# Patient Record
Sex: Female | Born: 1955
Health system: Southern US, Community
[De-identification: ages and names within clinical notes are randomized; demographics above are authoritative.]

## PROBLEM LIST (undated history)

## (undated) DIAGNOSIS — M8580 Other specified disorders of bone density and structure, unspecified site: Secondary | ICD-10-CM

## (undated) DIAGNOSIS — R232 Flushing: Secondary | ICD-10-CM

## (undated) DIAGNOSIS — Z780 Asymptomatic menopausal state: Secondary | ICD-10-CM

## (undated) DIAGNOSIS — N952 Postmenopausal atrophic vaginitis: Secondary | ICD-10-CM

## (undated) HISTORY — PX: WISDOM TOOTH EXTRACTION: SHX21

## (undated) HISTORY — DX: Postmenopausal atrophic vaginitis: N95.2

## (undated) HISTORY — PX: POLYPECTOMY: SHX149

## (undated) HISTORY — DX: Other specified disorders of bone density and structure, unspecified site: M85.80

## (undated) HISTORY — DX: Asymptomatic menopausal state: Z78.0

## (undated) HISTORY — DX: Flushing: R23.2

## (undated) HISTORY — PX: COLONOSCOPY: SHX174

---
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
## 2004-10-04 ENCOUNTER — Ambulatory Visit: Payer: Self-pay | Admitting: Obstetrics and Gynecology

## 2004-11-07 ENCOUNTER — Emergency Department: Payer: Self-pay | Admitting: Unknown Physician Specialty

## 2005-10-06 ENCOUNTER — Ambulatory Visit: Payer: Self-pay | Admitting: Obstetrics and Gynecology

## 2006-03-13 LAB — HM COLONOSCOPY

## 2006-09-03 ENCOUNTER — Ambulatory Visit: Payer: Self-pay | Admitting: Internal Medicine

## 2006-09-12 ENCOUNTER — Ambulatory Visit: Payer: Self-pay | Admitting: Internal Medicine

## 2006-10-09 ENCOUNTER — Ambulatory Visit: Payer: Self-pay | Admitting: Obstetrics and Gynecology

## 2006-10-12 ENCOUNTER — Ambulatory Visit: Payer: Self-pay | Admitting: Obstetrics and Gynecology

## 2007-04-22 ENCOUNTER — Ambulatory Visit: Payer: Self-pay | Admitting: Obstetrics and Gynecology

## 2007-10-11 ENCOUNTER — Ambulatory Visit: Payer: Self-pay | Admitting: Obstetrics and Gynecology

## 2008-10-22 ENCOUNTER — Ambulatory Visit: Payer: Self-pay | Admitting: Obstetrics and Gynecology

## 2009-10-26 ENCOUNTER — Ambulatory Visit: Payer: Self-pay | Admitting: Obstetrics and Gynecology

## 2010-10-28 ENCOUNTER — Ambulatory Visit: Payer: Self-pay | Admitting: Obstetrics and Gynecology

## 2011-10-31 ENCOUNTER — Ambulatory Visit: Payer: Self-pay | Admitting: Obstetrics and Gynecology

## 2012-09-09 LAB — HM PAP SMEAR: HM PAP: NEGATIVE

## 2012-10-31 ENCOUNTER — Ambulatory Visit: Payer: Self-pay | Admitting: Obstetrics and Gynecology

## 2013-11-10 ENCOUNTER — Ambulatory Visit: Payer: Self-pay | Admitting: Obstetrics and Gynecology

## 2013-11-10 LAB — HM MAMMOGRAPHY

## 2014-09-23 ENCOUNTER — Encounter: Payer: Self-pay | Admitting: Obstetrics and Gynecology

## 2014-11-11 ENCOUNTER — Ambulatory Visit (INDEPENDENT_AMBULATORY_CARE_PROVIDER_SITE_OTHER): Payer: 59 | Admitting: Obstetrics and Gynecology

## 2014-11-11 ENCOUNTER — Encounter: Payer: Self-pay | Admitting: Obstetrics and Gynecology

## 2014-11-11 VITALS — BP 133/80 | HR 93 | Ht 63.0 in | Wt 138.5 lb

## 2014-11-11 DIAGNOSIS — Z1211 Encounter for screening for malignant neoplasm of colon: Secondary | ICD-10-CM

## 2014-11-11 DIAGNOSIS — Z780 Asymptomatic menopausal state: Secondary | ICD-10-CM

## 2014-11-11 DIAGNOSIS — Z0000 Encounter for general adult medical examination without abnormal findings: Secondary | ICD-10-CM

## 2014-11-11 DIAGNOSIS — Z1231 Encounter for screening mammogram for malignant neoplasm of breast: Secondary | ICD-10-CM

## 2014-11-11 DIAGNOSIS — N952 Postmenopausal atrophic vaginitis: Secondary | ICD-10-CM

## 2014-11-11 DIAGNOSIS — Z01419 Encounter for gynecological examination (general) (routine) without abnormal findings: Secondary | ICD-10-CM

## 2014-11-11 DIAGNOSIS — M8580 Other specified disorders of bone density and structure, unspecified site: Secondary | ICD-10-CM

## 2014-11-11 NOTE — Progress Notes (Signed)
Patient ID: Jessica Frey, female   DOB: 03-30-1955, 59 y.o.   MRN: [id number] ANNUAL PREVENTATIVE CARE GYN  ENCOUNTER NOTE  Subjective:       Jessica Frey is a 59 y.o. No obstetric history on file. female here for a routine annual gynecologic exam.  Current complaints: 1.  None     Gynecologic History No LMP recorded. Patient is postmenopausal. Contraception: post menopausal status Last Pap: 2014 neg/neg. Results were: normal Last mammogram: 2015 birad 1. Results were: normal  Obstetric History Para 2002  Past Medical History  Diagnosis Date  . Menopause   . Osteopenia   . Vaginal atrophy   . Hot flashes     Past Surgical History  Procedure Laterality Date  . Cesarean section      [id number]  . Hernia repair  1959    Current Outpatient Prescriptions on File Prior to Visit  Medication Sig Dispense Refill  . calcium-vitamin D 250-100 MG-UNIT per tablet Take 1 tablet by mouth 2 (two) times daily.    . Collagen 500 MG CAPS Take by mouth.    Marland Kitchen geriatric multivitamins-minerals (ELDERTONIC/GEVRABON) ELIX Take 15 mLs by mouth daily.    . Vaginal Lubricant (REPLENS) GEL Place vaginally.     No current facility-administered medications on file prior to visit.    No Known Allergies  Social History   Social History  . Marital Status: Married    Spouse Name: N/A  . Number of Children: N/A  . Years of Education: N/A   Occupational History  . Not on file.   Social History Main Topics  . Smoking status: Never Smoker   . Smokeless tobacco: Not on file  . Alcohol Use: No  . Drug Use: No  . Sexual Activity: Yes   Other Topics Concern  . Not on file   Social History Narrative    Family History  Problem Relation Age of Onset  . Cancer Neg Hx   . Diabetes Neg Hx   . Heart disease Father     The following portions of the patient's history were reviewed and updated as appropriate: allergies, current medications, past family history, past medical history, past social  history, past surgical history and problem list.  Review of Systems ROS Review of Systems - General ROS: negative for - chills, fatigue, fever, hot flashes, night sweats, weight gain or weight loss Psychological ROS: negative for - anxiety, decreased libido, depression, mood swings, physical abuse or sexual abuse Ophthalmic ROS: negative for - blurry vision, eye pain or loss of vision ENT ROS: negative for - headaches, hearing change, visual changes or vocal changes Allergy and Immunology ROS: negative for - hives, itchy/watery eyes or seasonal allergies Hematological and Lymphatic ROS: negative for - bleeding problems, bruising, swollen lymph nodes or weight loss Endocrine ROS: negative for - galactorrhea, hair pattern changes, hot flashes, malaise/lethargy, mood swings, palpitations, polydipsia/polyuria, skin changes, temperature intolerance or unexpected weight changes Breast ROS: negative for - new or changing breast lumps or nipple discharge Respiratory ROS: negative for - cough or shortness of breath Cardiovascular ROS: negative for - chest pain, irregular heartbeat, palpitations or shortness of breath Gastrointestinal ROS: no abdominal pain, change in bowel habits, or black or bloody stools Genito-Urinary ROS: no dysuria, trouble voiding, or hematuria Musculoskeletal ROS: negative for - joint pain or joint stiffness Neurological ROS: negative for - bowel and bladder control changes Dermatological ROS: negative for rash and skin lesion changes   Objective:   BP 133/80 mmHg  Pulse 93  Ht 5\' 3"  (1.6 m)  Wt 138 lb 8 oz (62.823 kg)  BMI 24.54 kg/m2 CONSTITUTIONAL: Well-developed, well-nourished female in no acute distress.  PSYCHIATRIC: Normal mood and affect. Normal behavior. Normal judgment and thought content. Raymond: Alert and oriented to person, place, and time. Normal muscle tone coordination. No cranial nerve deficit noted. HENT:  Normocephalic, atraumatic, External right  and left ear normal. Oropharynx is clear and moist EYES: Conjunctivae and EOM are normal. Pupils are equal, round, and reactive to light. No scleral icterus.  NECK: Normal range of motion, supple, no masses.  Normal thyroid.  SKIN: Skin is warm and dry. No rash noted. Not diaphoretic. No erythema. No pallor. CARDIOVASCULAR: Normal heart rate noted, regular rhythm, no murmur. RESPIRATORY: Clear to auscultation bilaterally. Effort and breath sounds normal, no problems with respiration noted. BREASTS: Symmetric in size. No masses, skin changes, nipple drainage, or lymphadenopathy. ABDOMEN: Soft, normal bowel sounds, no distention noted.  No tenderness, rebound or guarding.  BLADDER: Normal PELVIC:  External Genitalia: Normal  BUS: Normal  Vagina: Narrowed introitus; moderate to severe atrophy  Cervix: Normal; no cervical motion tenderness  Uterus: Normal; midplane, small, mobile  Adnexa: Normal  RV: External Exam NormaI, No Rectal Masses and Normal Sphincter tone  MUSCULOSKELETAL: Normal range of motion. No tenderness.  No cyanosis, clubbing, or edema.  2+ distal pulses. LYMPHATIC: No Axillary, Supraclavicular, or Inguinal Adenopathy.    Assessment:   Annual gynecologic examination 59 y.o. Contraception: post menopausal status Normal BMI Vaginal atrophy-moderate to severe; patient declines Estrogen therapy  Plan:  Pap: Not needed Mammogram: Ordered Stool Guaiac Testing:  Ordered Labs: thru employer Routine preventative health maintenance measures emphasized: Exercise/Diet/Weight control, Tobacco Warnings and Alcohol/Substance use risks Vaginal atrophy as well as moderate to severe-recommendation is to use JO H20 gel as lubricant. Patient declines vaginal estrogen therapy. Return to [place], [place]  Brayton Mars, MD

## 2014-11-11 NOTE — Patient Instructions (Signed)
1. No pap today. 2. Schedule Mammogram 3. Stool cards for Colon Cancer screening. 4. JO H2O Lubricant as needed 5. Return in 1 year.

## 2014-11-16 DIAGNOSIS — Z780 Asymptomatic menopausal state: Secondary | ICD-10-CM | POA: Insufficient documentation

## 2014-11-16 DIAGNOSIS — M8580 Other specified disorders of bone density and structure, unspecified site: Secondary | ICD-10-CM | POA: Insufficient documentation

## 2014-11-16 DIAGNOSIS — N952 Postmenopausal atrophic vaginitis: Secondary | ICD-10-CM | POA: Insufficient documentation

## 2014-11-23 ENCOUNTER — Ambulatory Visit
Admission: RE | Admit: 2014-11-23 | Discharge: 2014-11-23 | Disposition: A | Discharge status: Home / Self Care | Payer: 59 | Source: Ambulatory Visit | Attending: Obstetrics and Gynecology | Admitting: Obstetrics and Gynecology

## 2014-11-23 ENCOUNTER — Other Ambulatory Visit: Payer: Self-pay | Admitting: Obstetrics and Gynecology

## 2014-11-23 DIAGNOSIS — Z1231 Encounter for screening mammogram for malignant neoplasm of breast: Secondary | ICD-10-CM | POA: Insufficient documentation

## 2015-09-27 ENCOUNTER — Ambulatory Visit: Payer: 59 | Attending: Orthopedic Surgery | Admitting: Physical Therapy

## 2015-09-27 DIAGNOSIS — M25562 Pain in left knee: Secondary | ICD-10-CM | POA: Insufficient documentation

## 2015-09-27 DIAGNOSIS — M6281 Muscle weakness (generalized): Secondary | ICD-10-CM | POA: Diagnosis present

## 2015-09-27 NOTE — Therapy (Signed)
[facility] [address], [place], [address] Phone: [phone]   Fax:  [phone]  Physical Therapy Evaluation  Patient Details  Name: Jessica Frey MRN: [id number] Date of Birth: 1955/03/16 Referring Provider: Rise Paganini Date: 09/27/2015      PT End of Session - 09/27/15 1250    Visit Number 1   Number of Visits 13   Date for PT Re-Evaluation 11/08/15   PT Start Time 1115   PT Stop Time 1200   PT Time Calculation (min) 45 min   Activity Tolerance Patient tolerated treatment well      Past Medical History  Diagnosis Date  . Menopause   . Osteopenia   . Vaginal atrophy   . Hot flashes     Past Surgical History  Procedure Laterality Date  . Cesarean section      [id number]  . Hernia repair  1959    There were no vitals filed for this visit.       Subjective Assessment - 09/27/15 1238    Subjective Pt reports several months of inconsistent L knee pain. Pain began with exercise, running. Since that time pt has had several flare ups of pain. Currently pt is avoiding running and all irritating factors and is pain free, but is not feeling normal and is unable to exercise like she used to. Pt denies any previous injuries other than L 5th metatarsal fx several yrs ago.   Pertinent History Denies back pain, denies red flag symptoms.   Diagnostic tests imaging, negative for arthritis   Patient Stated Goals Return to running   Currently in Pain? No/denies            [facility] PT Assessment - 09/27/15 0001    Assessment   Medical Diagnosis patellofemoral pain syndrome of left knee   Referring Provider Donna Christen   Next MD Visit none   Prior Therapy none   Precautions   Precautions None   Balance Screen   Has the patient fallen in the past 6 months No   Has the patient had a decrease in activity level because of a fear of falling?  No   Is the patient reluctant to leave their home because of  a fear of falling?  No   Prior Function   Level of Independence Independent   Vocation Full time employment   Vocation Requirements sitting, walking   Leisure exercise, running, yoga, lifting   ROM / Strength   AROM / PROM / Strength AROM   AROM   Overall AROM Comments Motion assessment: L knee flexion limited at end range by 10 deg., L knee strength grossly 1/2 MMT lower than opposite side.for hip abduction, hip extension   Palpation   Palpation comment (-) for ACL, MCL, PCL, LCL, - JLT,    Special Tests    Special Tests Knee Special Tests   Ambulation/Gait   Gait Comments gait is grossly WNL. noted decr. stance time in SLS on L, stairs are grossly WNL however noted decr. knee flexion on L.  Deferred running assessment for next session.           Objective: Supine bridge, progressing to single leg abduction bridge with extensive cuing. Able to perform 3x5.  Single leg sit<>stands on elevated surface, notably more difficult on involved side but able to work on this.    quad stretch in [place] position with yoga strap, 3x30 sec performed B.  Issued this as  HEP for pt.               PT Education - 09/27/15 1249    Education provided Yes   Education Details HEP   Person(s) Educated Patient   Methods Explanation   Comprehension Verbalized understanding             PT Long Term Goals - 09/27/15 1254    PT LONG TERM GOAL #1   Title Pt will be I with HEP to improve hip MMT to = R side.   Baseline off by 1/2 step   Time 6   Period Weeks   Status New   PT LONG TERM GOAL #2   Title Pt will improve L knee ROM to = R as seen by negative Ely test to reduce pain with ballet activities.   Baseline + Ely test on L.   Time 6   Period Weeks   Status New   PT LONG TERM GOAL #3   Title Pt will be able to run 12 miles per week pain free   Baseline unable to run without pain   Time 6   Period Weeks   Status New               Plan - 09/27/15 1250     Clinical Impression Statement Pt is a pleasant 60 y/o female with c/o chronic knee pain related to activity. She is currently able to perform yoga and ballet exercises, however is unable to run due to pain. Currently pt presents with significant mobility deficits including tightness in quads, weakness in L hip musculature, and poor motor control on L side possibly related to previous metatarsal fx  on L side. Pt would benefit from skilled PT to address these issues and allow return to running and higher level PLOF.   Rehab Potential Good   Clinical Impairments Affecting Rehab Potential motivation, PLOF   PT Frequency 2x / week   PT Duration 6 weeks   PT Treatment/Interventions ADLs/Self Care Home Management;Aquatic Therapy;Neuromuscular re-education;Patient/family education;Dry needling;Manual techniques;Therapeutic exercise;Therapeutic activities   PT Next Visit Plan assess running    Consulted and Agree with Plan of Care Patient      Patient will benefit from skilled therapeutic intervention in order to improve the following deficits and impairments:  Pain, Decreased range of motion, Decreased strength, Improper body mechanics  Visit Diagnosis: Pain in left knee  Muscle weakness (generalized)     Problem List Patient Active Problem List   Diagnosis Date Noted  . Menopause 11/16/2014  . Vaginal atrophy 11/16/2014  . Osteopenia 11/16/2014    Fisher,Benjamin PT DPT 09/27/2015, 12:57 PM  [facility] [address] [place], [address] Phone: [phone]   Fax:  [phone]  Name: Jessica Frey MRN: [id number] Date of Birth: 10-Jun-1955

## 2015-09-29 ENCOUNTER — Ambulatory Visit: Payer: 59 | Admitting: Physical Therapy

## 2015-09-29 DIAGNOSIS — M6281 Muscle weakness (generalized): Secondary | ICD-10-CM

## 2015-09-29 DIAGNOSIS — M25562 Pain in left knee: Secondary | ICD-10-CM | POA: Diagnosis not present

## 2015-09-29 NOTE — Therapy (Signed)
[facility] [address], [place], [address] Phone: [phone]   Fax:  [phone]  Physical Therapy Treatment  Patient Details  Name: Jessica Frey MRN: [id number] Date of Birth: [birth year] Referring Provider: Rise Paganini Date: 09/29/2015      PT End of Session - 09/29/15 0815    Visit Number 2   Number of Visits 13   Date for PT Re-Evaluation 11/08/15   PT Start Time 0730   PT Stop Time 0815   PT Time Calculation (min) 45 min   Activity Tolerance Patient tolerated treatment well      Past Medical History  Diagnosis Date  . Menopause   . Osteopenia   . Vaginal atrophy   . Hot flashes     Past Surgical History  Procedure Laterality Date  . Cesarean section      [id number]  . Hernia repair  1959    There were no vitals filed for this visit.      Subjective Assessment - 09/29/15 0814    Subjective Pt reports no pain since previous session. she has been consistent with her HEP.   Pertinent History Denies back pain, denies red flag symptoms.   Diagnostic tests imaging, negative for arthritis   Patient Stated Goals Return to running   Currently in Pain? No/denies                  Objective: Running assessment, cadence is 145. Educated pt on importance of incr. Cadence.  Hop assessment, pt has very poor single leg hop on L, reports no pain but unable to achieve significant distance. No pain.  "runner's 6" issued and performed, 2x10 of:  Heel taps on 4" step with cuing for pelvic control  Hip abductions on 4" step  Hip abductions in low position of heel tap from 4" step  "run" position step downs  "run" position arm swings  "run" position leg oscillations.  Pt required cuing to perform slowly, to work on hip control. Reproduced pain with forward lean step downs. With cuing to avoid lean pain improved consonant with incr. glute  activation.                    PT Long Term Goals - 09/27/15 1254    PT LONG TERM GOAL #1   Title Pt will be I with HEP to improve hip MMT to = R side.   Baseline off by 1/2 step   Time 6   Period Weeks   Status New   PT LONG TERM GOAL #2   Title Pt will improve L knee ROM to = R as seen by negative Ely test to reduce pain with ballet activities.   Baseline + Ely test on L.   Time 6   Period Weeks   Status New   PT LONG TERM GOAL #3   Title Pt will be able to run 12 miles per week pain free   Baseline unable to run without pain   Time 6   Period Weeks   Status New               Plan - 09/29/15 0815    Clinical Impression Statement able to achieve reproduction of pt symptoms with step up when leaning forward, able to correct with cuing for upright posture to improve glute activation. will assess for presence of back symptoms at next session. Issued and had pt perform higher level  frontal plane exercise routine and core stability routine with running challenges/arm/hip swings.    Rehab Potential Good   Clinical Impairments Affecting Rehab Potential motivation, PLOF   PT Frequency 2x / week   PT Duration 6 weeks   PT Treatment/Interventions ADLs/Self Care Home Management;Aquatic Therapy;Neuromuscular re-education;Patient/family education;Dry needling;Manual techniques;Therapeutic exercise;Therapeutic activities   PT Next Visit Plan assess running    Consulted and Agree with Plan of Care Patient      Patient will benefit from skilled therapeutic intervention in order to improve the following deficits and impairments:  Pain, Decreased range of motion, Decreased strength, Improper body mechanics  Visit Diagnosis: Muscle weakness (generalized)     Problem List Patient Active Problem List   Diagnosis Date Noted  . Menopause 11/16/2014  . Vaginal atrophy 11/16/2014  . Osteopenia 11/16/2014    Fisher,Benjamin PT DPT 09/29/2015, 8:17 AM  [facility] [address], [place], [address] Phone: [phone]   Fax:  [phone]  Name: Jessica Frey MRN: [id number] Date of Birth: 1955/07/09

## 2015-10-04 ENCOUNTER — Ambulatory Visit: Payer: 59 | Admitting: Physical Therapy

## 2015-10-04 DIAGNOSIS — M25562 Pain in left knee: Secondary | ICD-10-CM | POA: Diagnosis not present

## 2015-10-04 DIAGNOSIS — M6281 Muscle weakness (generalized): Secondary | ICD-10-CM

## 2015-10-04 NOTE — Therapy (Signed)
[facility] [address], [place], [address] Phone: [phone]   Fax:  [phone]  Physical Therapy Treatment  Patient Details  Name: Jessica Frey MRN: [id number] Date of Birth: 08-12-1955 Referring Provider: Donna Christen  Encounter Date: 10/04/2015      PT End of Session - 10/04/15 1334    Visit Number 3   Number of Visits 13   Date for PT Re-Evaluation 11/08/15   PT Start Time 1300   PT Stop Time 1330   PT Time Calculation (min) 30 min   Activity Tolerance Patient tolerated treatment well      Past Medical History:  Diagnosis Date  . Hot flashes   . Menopause   . Osteopenia   . Vaginal atrophy     Past Surgical History:  Procedure Laterality Date  . CESAREAN SECTION     [id number]  . HERNIA REPAIR  1959    There were no vitals filed for this visit.      Subjective Assessment - 10/04/15 1333    Subjective Pt reports she tried walking 5 miles, had very mild pain in the 5th minute.   Pertinent History Denies back pain, denies red flag symptoms.   Diagnostic tests imaging, negative for arthritis   Patient Stated Goals Return to running   Currently in Pain? No/denies                Objective: Reviewed and corrected: Heel taps 3x10  Heel tap with abduction 3x10  Knee flexion stretching in standing, prone with strap.  Performed knee flexion stretch with C-R which improved ROM to = opposite side  Issued and reviewed extensive return to running program focusing on 5 min warmup, 1 min run 3 min walk for 10 reps, then 5 min cool down. Pt has previously issued warmup program and encouraged to continue with this.  Pt educated on how to assess pain for possible need to stop running if pain is bothering her.  Self mobilization for tib/femur in seated, to be performed if pt develops pain in joint.  Pt verbalized and demonstrated understanding of all  exercises.                      PT Long Term Goals - 09/27/15 1254      PT LONG TERM GOAL #1   Title Pt will be I with HEP to improve hip MMT to = R side.   Baseline off by 1/2 step   Time 6   Period Weeks   Status New     PT LONG TERM GOAL #2   Title Pt will improve L knee ROM to = R as seen by negative Ely test to reduce pain with ballet activities.   Baseline + Ely test on L.   Time 6   Period Weeks   Status New     PT LONG TERM GOAL #3   Title Pt will be able to run 12 miles per week pain free   Baseline unable to run without pain   Time 6   Period Weeks   Status New               Plan - 10/04/15 1334    Clinical Impression Statement pt is now having minimal to no pain, improved control in L hip. is still somewhat limited in end range knee flexion. Would benefit from continued skilled PT to address this.   Rehab Potential  Good   Clinical Impairments Affecting Rehab Potential motivation, PLOF   PT Frequency 2x / week   PT Duration 6 weeks   PT Treatment/Interventions ADLs/Self Care Home Management;Aquatic Therapy;Neuromuscular re-education;Patient/family education;Dry needling;Manual techniques;Therapeutic exercise;Therapeutic activities   PT Next Visit Plan assess running    Consulted and Agree with Plan of Care Patient      Patient will benefit from skilled therapeutic intervention in order to improve the following deficits and impairments:  Pain, Decreased range of motion, Decreased strength, Improper body mechanics  Visit Diagnosis: Muscle weakness (generalized)     Problem List Patient Active Problem List   Diagnosis Date Noted  . Menopause 11/16/2014  . Vaginal atrophy 11/16/2014  . Osteopenia 11/16/2014    Harace Mccluney PT DPT 10/04/2015, 1:35 PM  [facility] [address], [place], [address] Phone: [phone]   Fax:  [phone]  Name: Chiante Genna MRN: [id number] Date of Birth: 28-May-1955

## 2015-10-06 ENCOUNTER — Encounter: Payer: 59 | Admitting: Physical Therapy

## 2015-10-11 ENCOUNTER — Ambulatory Visit: Payer: 59 | Admitting: Physical Therapy

## 2015-10-11 DIAGNOSIS — M25562 Pain in left knee: Secondary | ICD-10-CM

## 2015-10-11 DIAGNOSIS — M6281 Muscle weakness (generalized): Secondary | ICD-10-CM

## 2015-10-11 NOTE — Therapy (Signed)
[facility] [address], [place], [address] Phone: [phone]   Fax:  [phone]  Physical Therapy Treatment  Patient Details  Name: Jessica Frey MRN: [id number] Date of Birth: 07-11-1955 Referring Provider: Rise Paganini Date: 10/11/2015      PT End of Session - 10/11/15 0903    Visit Number 4   Number of Visits 13   Date for PT Re-Evaluation 11/08/15   PT Start Time 0830   PT Stop Time 0858   PT Time Calculation (min) 28 min   Activity Tolerance Patient tolerated treatment well      Past Medical History:  Diagnosis Date  . Hot flashes   . Menopause   . Osteopenia   . Vaginal atrophy     Past Surgical History:  Procedure Laterality Date  . CESAREAN SECTION     [id number]  . HERNIA REPAIR  1959    There were no vitals filed for this visit.      Subjective Assessment - 10/11/15 0902    Subjective Pt reports she has been able to run, has had two instances of knee pain, one with gentle twisting and one with running.   Pertinent History Denies back pain, denies red flag symptoms.   Diagnostic tests imaging, negative for arthritis   Patient Stated Goals Return to running   Currently in Pain? No/denies               Objective: Reviewed running routine, re-educated pt on cadence, running pattern.  Sprinting, performed light sprint to ensure form is appropriate. Educated pt on performing hill sprints.  Issued sprint routine of 4 min warmup, 30 sec. Sprint, 1.5 min cooldown, repeat 8x, then cool down.  Pt able to sprint well with minimal pain when perorming on hill.  Reviewed HEP, progressed to light lunges.  Manual tib/femur ap, pa 2x1 min each.  Fibular head mobs performed 2x1 min.  Following this pt reported 0/10 pain in knee with rotaion.                       PT Long Term Goals - 09/27/15 1254      PT LONG TERM GOAL #1   Title Pt will be I with  HEP to improve hip MMT to = R side.   Baseline off by 1/2 step   Time 6   Period Weeks   Status New     PT LONG TERM GOAL #2   Title Pt will improve L knee ROM to = R as seen by negative Ely test to reduce pain with ballet activities.   Baseline + Ely test on L.   Time 6   Period Weeks   Status New     PT LONG TERM GOAL #3   Title Pt will be able to run 12 miles per week pain free   Baseline unable to run without pain   Time 6   Period Weeks   Status New               Plan - 10/11/15 0903    Clinical Impression Statement Pt demonstrated mild pain with twisting/turning knee so addressed this with manual intervention. progressed running routine to level 2 and added in hill sprints.   Rehab Potential Good   Clinical Impairments Affecting Rehab Potential motivation, PLOF   PT Frequency 2x / week   PT Duration 6 weeks   PT Treatment/Interventions ADLs/Self  Care Home Management;Aquatic Therapy;Neuromuscular re-education;Patient/family education;Dry needling;Manual techniques;Therapeutic exercise;Therapeutic activities   PT Next Visit Plan assess running    Consulted and Agree with Plan of Care Patient      Patient will benefit from skilled therapeutic intervention in order to improve the following deficits and impairments:  Pain, Decreased range of motion, Decreased strength, Improper body mechanics  Visit Diagnosis: Pain in left knee  Muscle weakness (generalized)     Problem List Patient Active Problem List   Diagnosis Date Noted  . Menopause 11/16/2014  . Vaginal atrophy 11/16/2014  . Osteopenia 11/16/2014    Joshlynn Alfonzo  PT DPT 10/11/2015, 9:07 AM  [facility] [address], [place], [address] Phone: [phone]   Fax:  [phone]  Name: Pennee Mcilwain MRN: [id number] Date of Birth: 03-22-1955

## 2015-10-14 ENCOUNTER — Encounter: Payer: 59 | Admitting: Physical Therapy

## 2015-10-19 ENCOUNTER — Ambulatory Visit: Payer: 59 | Attending: Orthopedic Surgery | Admitting: Physical Therapy

## 2015-10-19 DIAGNOSIS — M6281 Muscle weakness (generalized): Secondary | ICD-10-CM | POA: Insufficient documentation

## 2015-10-19 DIAGNOSIS — M25562 Pain in left knee: Secondary | ICD-10-CM | POA: Diagnosis present

## 2015-10-19 NOTE — Therapy (Signed)
[facility] [address], [place], [address] Phone: [phone]   Fax:  [phone]  Physical Therapy Treatment  Patient Details  Name: Jessica Frey MRN: [id number] Date of Birth: 1955-10-11 Referring Provider: Rise Paganini Date: 10/19/2015      PT End of Session - 10/19/15 0904    Visit Number 5   Number of Visits 13   Date for PT Re-Evaluation 11/08/15   PT Start Time 0833   PT Stop Time 0900   PT Time Calculation (min) 27 min   Activity Tolerance Patient tolerated treatment well      Past Medical History:  Diagnosis Date  . Hot flashes   . Menopause   . Osteopenia   . Vaginal atrophy     Past Surgical History:  Procedure Laterality Date  . CESAREAN SECTION     [id number]  . HERNIA REPAIR  1959    There were no vitals filed for this visit.      Subjective Assessment - 10/19/15 0900    Subjective Pt reports mildly incr. L medial knee pain with hiking at significant elevation.   Pertinent History Denies back pain, denies red flag symptoms.   Diagnostic tests imaging, negative for arthritis   Patient Stated Goals Return to running   Currently in Pain? Yes   Pain Score 1    Pain Location Knee   Pain Orientation Left               Objective: Hyperextension mob 3x30 in full knee extension, initially painful, improved with this.  Rotational mob for tibial IR 3x30 grade II  Tib/femur AP, PA grade II 3x1 min each side.  Following this reassessed pain with full extension and noted decr. Pain with this.  Issued and had pt perform single leg HS stretch, cued pt manually to perform end range hyperextension as a distinct stretch to be performed 3x10.  Pt reported no pain following session.                  PT Education - 10/19/15 0902    Education provided Yes   Education Details not progressing running routine due to incr. pain, avoiding running on the beach    Person(s) Educated Patient   Methods Explanation   Comprehension Verbalized understanding             PT Long Term Goals - 09/27/15 1254      PT LONG TERM GOAL #1   Title Pt will be I with HEP to improve hip MMT to = R side.   Baseline off by 1/2 step   Time 6   Period Weeks   Status New     PT LONG TERM GOAL #2   Title Pt will improve L knee ROM to = R as seen by negative Ely test to reduce pain with ballet activities.   Baseline + Ely test on L.   Time 6   Period Weeks   Status New     PT LONG TERM GOAL #3   Title Pt will be able to run 12 miles per week pain free   Baseline unable to run without pain   Time 6   Period Weeks   Status New               Plan - 10/19/15 [id number]    Clinical Impression Statement Able to reproduce pain with end range knee hyperextension. focused on addressing this  manually - will look to progress HEP at next session.   Rehab Potential Good   Clinical Impairments Affecting Rehab Potential motivation, PLOF   PT Frequency 2x / week   PT Duration 6 weeks   PT Treatment/Interventions ADLs/Self Care Home Management;Aquatic Therapy;Neuromuscular re-education;Patient/family education;Dry needling;Manual techniques;Therapeutic exercise;Therapeutic activities   PT Next Visit Plan assess running    Consulted and Agree with Plan of Care Patient      Patient will benefit from skilled therapeutic intervention in order to improve the following deficits and impairments:  Pain, Decreased range of motion, Decreased strength, Improper body mechanics  Visit Diagnosis: Pain in left knee     Problem List Patient Active Problem List   Diagnosis Date Noted  . Menopause 11/16/2014  . Vaginal atrophy 11/16/2014  . Osteopenia 11/16/2014    Omara Alcon PT DPT 10/19/2015, 9:08 AM  [facility] [address] [place], [address] Phone: [phone]   Fax:   [phone]  Name: Jessica Frey MRN: [id number] Date of Birth: November 18, 1955

## 2015-10-25 ENCOUNTER — Ambulatory Visit: Payer: 59 | Admitting: Physical Therapy

## 2015-10-25 DIAGNOSIS — M25562 Pain in left knee: Secondary | ICD-10-CM

## 2015-10-25 DIAGNOSIS — M6281 Muscle weakness (generalized): Secondary | ICD-10-CM

## 2015-10-25 NOTE — Therapy (Signed)
[facility] [address] [place], [address] Phone: [phone]   Fax:  [phone]  Physical Therapy Treatment  Patient Details  Name: Jessica Frey MRN: [id number] Date of Birth: January 24, 1956 Referring Provider: Rise Paganini Date: 10/25/2015      PT End of Session - 10/25/15 1513    Visit Number 6   Number of Visits 13   Date for PT Re-Evaluation 11/08/15   PT Start Time [id number]   PT Stop Time 1508   PT Time Calculation (min) 23 min   Activity Tolerance Patient tolerated treatment well      Past Medical History:  Diagnosis Date  . Hot flashes   . Menopause   . Osteopenia   . Vaginal atrophy     Past Surgical History:  Procedure Laterality Date  . CESAREAN SECTION     [id number]  . HERNIA REPAIR  1959    There were no vitals filed for this visit.      Subjective Assessment - 10/25/15 1510    Subjective Pt reports no difficulty since previous session, she is still limiting her activity with running due to fear of pain.   Pertinent History Denies back pain, denies red flag symptoms.   Diagnostic tests imaging, negative for arthritis   Patient Stated Goals Return to running   Currently in Pain? No/denies            Objective: Focus of session on exercise/running strategy for return to full running routine. Pt will do one additional bout of 4 min warmup, 3 min run/1 min walk x10, 4 min cool down.  If pain with this is no greater than an irritation at that time pt will perform:  4 min warmup, 40 min run, 4 min cooldown  Reviewed pt cadence which is appropriate at 165 SPM,   Reviewed self mobilization and post run care techniques including using heat to reduce pain, self MFR using tennis ball.  Pt demonstrates and verbalizes understanding of all exercises.                     PT Education - 10/25/15 1512    Education provided Yes   Education Details progression  of running.   Person(s) Educated Patient   Methods Explanation   Comprehension Verbalized understanding             PT Long Term Goals - 09/27/15 1254      PT LONG TERM GOAL #1   Title Pt will be I with HEP to improve hip MMT to = R side.   Baseline off by 1/2 step   Time 6   Period Weeks   Status New     PT LONG TERM GOAL #2   Title Pt will improve L knee ROM to = R as seen by negative Ely test to reduce pain with ballet activities.   Baseline + Ely test on L.   Time 6   Period Weeks   Status New     PT LONG TERM GOAL #3   Title Pt will be able to run 12 miles per week pain free   Baseline unable to run without pain   Time 6   Period Weeks   Status New               Plan - 10/25/15 1514    Clinical Impression Statement Pt may be appropriate for d/c at next session pending ability  to perform full running routine. Pt is having minimal to no pain at this time.   Rehab Potential Good   Clinical Impairments Affecting Rehab Potential motivation, PLOF   PT Frequency 2x / week   PT Duration 6 weeks   PT Treatment/Interventions ADLs/Self Care Home Management;Aquatic Therapy;Neuromuscular re-education;Patient/family education;Dry needling;Manual techniques;Therapeutic exercise;Therapeutic activities   PT Next Visit Plan assess running    Consulted and Agree with Plan of Care Patient      Patient will benefit from skilled therapeutic intervention in order to improve the following deficits and impairments:  Pain, Decreased range of motion, Decreased strength, Improper body mechanics  Visit Diagnosis: Pain in left knee  Muscle weakness (generalized)     Problem List Patient Active Problem List   Diagnosis Date Noted  . Menopause 11/16/2014  . Vaginal atrophy 11/16/2014  . Osteopenia 11/16/2014    Fisher,Benjamin PT DPT 10/25/2015, 3:17 PM  [facility] [address] [place],  [address] Phone: [phone]   Fax:  [phone]  Name: Jessica Frey MRN: [id number] Date of Birth: [birth year]

## 2015-11-04 ENCOUNTER — Ambulatory Visit: Payer: 59 | Admitting: Physical Therapy

## 2015-11-10 ENCOUNTER — Ambulatory Visit: Payer: 59 | Admitting: Physical Therapy

## 2015-11-16 ENCOUNTER — Encounter: Payer: Self-pay | Admitting: Obstetrics and Gynecology

## 2015-11-16 ENCOUNTER — Ambulatory Visit (INDEPENDENT_AMBULATORY_CARE_PROVIDER_SITE_OTHER): Payer: 59 | Admitting: Obstetrics and Gynecology

## 2015-11-16 VITALS — BP 138/76 | HR 102 | Ht 63.0 in | Wt 133.8 lb

## 2015-11-16 DIAGNOSIS — Z01419 Encounter for gynecological examination (general) (routine) without abnormal findings: Secondary | ICD-10-CM | POA: Diagnosis not present

## 2015-11-16 DIAGNOSIS — Z1239 Encounter for other screening for malignant neoplasm of breast: Secondary | ICD-10-CM | POA: Diagnosis not present

## 2015-11-16 DIAGNOSIS — Z1211 Encounter for screening for malignant neoplasm of colon: Secondary | ICD-10-CM | POA: Diagnosis not present

## 2015-11-16 MED ORDER — ESTROGENS, CONJUGATED 0.625 MG/GM VA CREA: 0.2500 | TOPICAL_CREAM | VAGINAL | 12 refills | Status: DC

## 2015-11-16 NOTE — Progress Notes (Signed)
ANNUAL PREVENTATIVE CARE GYN  ENCOUNTER NOTE  Subjective:       Jessica Frey is a 60 y.o. No obstetric history on file. female here for a routine annual gynecologic exam.  Current complaints: 1.  none 2. Vaginal atrophy   Gynecologic History No LMP recorded. Patient is postmenopausal. Contraception: post menopausal status Last Pap: 2014 n/n. Results were: normal Last mammogram: 2016 birad 1. Results were: normal  Obstetric History OB History  No data available    Past Medical History:  Diagnosis Date  . Hot flashes   . Menopause   . Osteopenia   . Vaginal atrophy     Past Surgical History:  Procedure Laterality Date  . CESAREAN SECTION     [id number]  . HERNIA REPAIR  1959    Current Outpatient Prescriptions on File Prior to Visit  Medication Sig Dispense Refill  . calcium-vitamin D 250-100 MG-UNIT per tablet Take 1 tablet by mouth 2 (two) times daily.    . Collagen 500 MG CAPS Take by mouth.    Marland Kitchen geriatric multivitamins-minerals (ELDERTONIC/GEVRABON) ELIX Take 15 mLs by mouth daily.    . Vaginal Lubricant (REPLENS) GEL Place vaginally.     No current facility-administered medications on file prior to visit.     No Known Allergies  Social History   Social History  . Marital status: Married    Spouse name: N/A  . Number of children: N/A  . Years of education: N/A   Occupational History  . Not on file.   Social History Main Topics  . Smoking status: Never Smoker  . Smokeless tobacco: Not on file  . Alcohol use No  . Drug use: No  . Sexual activity: Yes   Other Topics Concern  . Not on file   Social History Narrative  . No narrative on file    Family History  Problem Relation Age of Onset  . Cancer Neg Hx   . Diabetes Neg Hx   . Heart disease Father     The following portions of the patient's history were reviewed and updated as appropriate: allergies, current medications, past family history, past medical history, past social history, past  surgical history and problem list.  Review of Systems ROS Review of Systems - General ROS: negative for - chills, fatigue, fever, hot flashes, night sweats, weight gain or weight loss Psychological ROS: negative for - anxiety, decreased libido, depression, mood swings, physical abuse or sexual abuse Ophthalmic ROS: negative for - blurry vision, eye pain or loss of vision ENT ROS: negative for - headaches, hearing change, visual changes or vocal changes Allergy and Immunology ROS: negative for - hives, itchy/watery eyes or seasonal allergies Hematological and Lymphatic ROS: negative for - bleeding problems, bruising, swollen lymph nodes or weight loss Endocrine ROS: negative for - galactorrhea, hair pattern changes, hot flashes, malaise/lethargy, mood swings, palpitations, polydipsia/polyuria, skin changes, temperature intolerance or unexpected weight changes Breast ROS: negative for - new or changing breast lumps or nipple discharge Respiratory ROS: negative for - cough or shortness of breath Cardiovascular ROS: negative for - chest pain, irregular heartbeat, palpitations or shortness of breath Gastrointestinal ROS: no abdominal pain, change in bowel habits, or black or bloody stools Genito-Urinary ROS: no dysuria, trouble voiding, or hematuria Musculoskeletal ROS: negative for - joint pain or joint stiffness Neurological ROS: negative for - bowel and bladder control changes Dermatological ROS: negative for rash and skin lesion changes   Objective:   BP 138/76   Pulse (!) 102  Ht 5\' 3"  (1.6 m)   Wt 133 lb 12.8 oz (60.7 kg)   BMI 23.70 kg/m  CONSTITUTIONAL: Well-developed, well-nourished female in no acute distress.  PSYCHIATRIC: Normal mood and affect. Normal behavior. Normal judgment and thought content. [place]: Alert and oriented to person, place, and time. Normal muscle tone coordination. No cranial nerve deficit noted. HENT:  Normocephalic, atraumatic, External right and left  ear normal. Oropharynx is clear and moist EYES: Conjunctivae and EOM are normal. Pupils are equal, round, and reactive to light. No scleral icterus.  NECK: Normal range of motion, supple, no masses.  Normal thyroid.  SKIN: Skin is warm and dry. No rash noted. Not diaphoretic. No erythema. No pallor. CARDIOVASCULAR: Normal heart rate noted, regular rhythm, no murmur. RESPIRATORY: Clear to auscultation bilaterally. Effort and breath sounds normal, no problems with respiration noted. BREASTS: Symmetric in size. No masses, skin changes, nipple drainage, or lymphadenopathy. ABDOMEN: Soft, normal bowel sounds, no distention noted.  No tenderness, rebound or guarding.  BLADDER: Normal PELVIC:  External Genitalia: Normal  BUS: Normal  Vagina: Moderate to severe vaginal atrophy; single digit exam performed   Cervix: Normal; No lesions  Uterus: Normal; Midplane, small, mobile, nontender  Adnexa: Normal  RV: External Exam NormaI, No Rectal Masses and Normal Sphincter tone  MUSCULOSKELETAL: Normal range of motion. No tenderness.  No cyanosis, clubbing, or edema.  2+ distal pulses. LYMPHATIC: No Axillary, Supraclavicular, or Inguinal Adenopathy.    Assessment:   Annual gynecologic examination 60 y.o. Contraception: post menopausal status Normal BMI Problem List Items Addressed This Visit    None    Visit Diagnoses   None.    Vaginal atrophy  Plan:  Pap: Pap Co Test Mammogram: Ordered Stool Guaiac Testing:  Ordered Labs: thut employer Routine preventative health maintenance measures emphasized: Exercise/Diet/Weight control, Tobacco Warnings and Alcohol/Substance use risks Return to Clinic - 1 Year Premarin cream 1/2-1 g intravaginal twice weekly  Joyice Faster, CMA  Brayton Mars, MD  Note: This dictation was prepared with Dragon dictation along with smaller phrase technology. Any transcriptional errors that result from this process are unintentional.

## 2015-11-16 NOTE — Addendum Note (Signed)
Addended by: Elouise Munroe on: 11/16/2015 08:56 AM   Modules accepted: Orders

## 2015-11-16 NOTE — Addendum Note (Signed)
Addended by: Elouise Munroe on: 11/16/2015 03:32 PM   Modules accepted: Orders

## 2015-11-16 NOTE — Patient Instructions (Signed)
1. Pap smear 2. Mammogram ordered 3. Stool guaiac cards given 4. Begin Premarin cream intravaginal one half to 1 g twice weekly 5. Continue with healthy eating and exercise 6. Continue with calcium and vitamin D supplementation 1200 mg a 7. Return in 1 year

## 2015-11-20 LAB — PAP IG AND HPV HIGH-RISK
HPV, high-risk: NEGATIVE
PAP SMEAR COMMENT: 0

## 2015-12-08 ENCOUNTER — Ambulatory Visit: Payer: 59

## 2015-12-08 ENCOUNTER — Ambulatory Visit
Admission: RE | Admit: 2015-12-08 | Discharge: 2015-12-08 | Disposition: A | Discharge status: Home / Self Care | Payer: 59 | Source: Ambulatory Visit | Attending: Obstetrics and Gynecology | Admitting: Obstetrics and Gynecology

## 2015-12-08 DIAGNOSIS — Z1239 Encounter for other screening for malignant neoplasm of breast: Secondary | ICD-10-CM

## 2015-12-08 DIAGNOSIS — Z1231 Encounter for screening mammogram for malignant neoplasm of breast: Secondary | ICD-10-CM | POA: Insufficient documentation

## 2015-12-27 ENCOUNTER — Ambulatory Visit: Payer: 59 | Attending: Orthopedic Surgery | Admitting: Physical Therapy

## 2016-07-11 ENCOUNTER — Encounter: Payer: Self-pay | Admitting: Gastroenterology

## 2016-07-17 ENCOUNTER — Encounter: Payer: Self-pay | Admitting: Gastroenterology

## 2016-08-28 ENCOUNTER — Ambulatory Visit (AMBULATORY_SURGERY_CENTER): Payer: Self-pay

## 2016-08-28 VITALS — Ht 63.0 in | Wt 140.6 lb

## 2016-08-28 DIAGNOSIS — Z1211 Encounter for screening for malignant neoplasm of colon: Secondary | ICD-10-CM

## 2016-08-28 MED ORDER — NA SULFATE-K SULFATE-MG SULF 17.5-3.13-1.6 GM/177ML PO SOLN: 1.0000 | Freq: Once | ORAL | 0 refills | Status: AC

## 2016-08-28 NOTE — Progress Notes (Signed)
Denies allergies to eggs or soy products. Denies complication of anesthesia or sedation. Denies use of weight loss medication. Denies use of O2.   Emmi instructions declined. Patient has had colonoscopies.

## 2016-09-07 ENCOUNTER — Encounter: Payer: Self-pay | Admitting: Gastroenterology

## 2016-09-11 ENCOUNTER — Encounter: Payer: 59 | Admitting: Gastroenterology

## 2016-09-21 ENCOUNTER — Ambulatory Visit (AMBULATORY_SURGERY_CENTER): Payer: BLUE CROSS/BLUE SHIELD | Admitting: Gastroenterology

## 2016-09-21 ENCOUNTER — Encounter: Payer: Self-pay | Admitting: Gastroenterology

## 2016-09-21 VITALS — BP 104/76 | HR 57 | Temp 98.9°F | Resp 9 | Ht 63.0 in | Wt 140.0 lb

## 2016-09-21 DIAGNOSIS — Z1212 Encounter for screening for malignant neoplasm of rectum: Secondary | ICD-10-CM

## 2016-09-21 DIAGNOSIS — D122 Benign neoplasm of ascending colon: Secondary | ICD-10-CM | POA: Diagnosis not present

## 2016-09-21 DIAGNOSIS — Z1211 Encounter for screening for malignant neoplasm of colon: Secondary | ICD-10-CM

## 2016-09-21 MED ORDER — SODIUM CHLORIDE 0.9 % IV SOLN: 500.0000 mL | INTRAVENOUS | Status: AC

## 2016-09-21 NOTE — Progress Notes (Signed)
Report to PACU, RN, vss, BBS= Clear.  

## 2016-09-21 NOTE — Progress Notes (Signed)
Called to room to assist during endoscopic procedure.  Patient ID and intended procedure confirmed with present staff. Received instructions for my participation in the procedure from the performing physician.  

## 2016-09-21 NOTE — Op Note (Signed)
[facility] Patient Name: Jessica Frey Procedure Date: 09/21/2016 7:57 AM MRN: [id number] Endoscopist: Remo Lipps P. Armbruster MD, MD Age: 61 Referring MD:  Date of Birth: Apr 20, 1955 Gender: Female Account #: [id number] Procedure:                Colonoscopy Indications:              Screening for colorectal malignant neoplasm Medicines:                Monitored Anesthesia Care Procedure:                Pre-Anesthesia Assessment:                           - Prior to the procedure, a History and Physical                            was performed, and patient medications and                            allergies were reviewed. The patient's tolerance of                            previous anesthesia was also reviewed. The risks                            and benefits of the procedure and the sedation                            options and risks were discussed with the patient.                            All questions were answered, and informed consent                            was obtained. Prior Anticoagulants: The patient has                            taken no previous anticoagulant or antiplatelet                            agents. ASA Grade Assessment: II - A patient with                            mild systemic disease. After reviewing the risks                            and benefits, the patient was deemed in                            satisfactory condition to undergo the procedure.                           After obtaining informed consent, the colonoscope  was passed under direct vision. Throughout the                            procedure, the patient's blood pressure, pulse, and                            oxygen saturations were monitored continuously. The                            Colonoscope was introduced through the anus and                            advanced to the the cecum, identified by                            appendiceal orifice  and ileocecal valve. The                            colonoscopy was performed without difficulty. The                            patient tolerated the procedure well. The quality                            of the bowel preparation was good. The ileocecal                            valve, appendiceal orifice, and rectum were                            photographed. Scope In: 8:00:11 AM Scope Out: 8:18:17 AM Scope Withdrawal Time: 0 hours 12 minutes 33 seconds  Total Procedure Duration: 0 hours 18 minutes 6 seconds  Findings:                 The perianal and digital rectal examinations were                            normal.                           Multiple small-mouthed diverticula were found in                            the left colon.                           A 7 mm polyp was found in the ascending colon. The                            polyp was flat. The polyp was removed with a cold                            snare. Resection and retrieval were complete.  The colon was tortuous.                           The exam was otherwise without abnormality on                            direct and retroflexion views. The rectal vault was                            narrow. Complications:            No immediate complications. Estimated blood loss:                            Minimal. Estimated Blood Loss:     Estimated blood loss was minimal. Impression:               - Diverticulosis in the left colon.                           - One 7 mm polyp in the ascending colon, removed                            with a cold snare. Resected and retrieved.                           - Tortuous colon.                           - The examination was otherwise normal on direct                            and retroflexion views. Recommendation:           - Patient has a contact number available for                            emergencies. The signs and symptoms of potential                             delayed complications were discussed with the                            patient. Return to normal activities tomorrow.                            Written discharge instructions were provided to the                            patient.                           - Resume previous diet.                           - Continue present medications.                           -  Await pathology results.                           - Repeat colonoscopy is recommended for                            surveillance. The colonoscopy date will be                            determined after pathology results from today's                            exam become available for review.                           - No ibuprofen, naproxen, or other non-steroidal                            anti-inflammatory drugs for 2 weeks after polyp                            removal. Remo Lipps P. Armbruster MD, MD 09/21/2016 8:22:29 AM This report has been signed electronically.

## 2016-09-21 NOTE — Patient Instructions (Signed)
YOU HAD AN ENDOSCOPIC PROCEDURE TODAY AT THE [facility]:   Refer to the procedure report that was given to you for any specific questions about what was found during the examination.  If the procedure report does not answer your questions, please call your gastroenterologist to clarify.  If you requested that your care partner not be given the details of your procedure findings, then the procedure report has been included in a sealed envelope for you to review at your convenience later.  YOU SHOULD EXPECT: Some feelings of bloating in the abdomen. Passage of more gas than usual.  Walking can help get rid of the air that was put into your GI tract during the procedure and reduce the bloating. If you had a lower endoscopy (such as a colonoscopy or flexible sigmoidoscopy) you may notice spotting of blood in your stool or on the toilet paper. If you underwent a bowel prep for your procedure, you may not have a normal bowel movement for a few days.  Please Note:  You might notice some irritation and congestion in your nose or some drainage.  This is from the oxygen used during your procedure.  There is no need for concern and it should clear up in a day or so.  SYMPTOMS TO REPORT IMMEDIATELY:   Following lower endoscopy (colonoscopy or flexible sigmoidoscopy):  Excessive amounts of blood in the stool  Significant tenderness or worsening of abdominal pains  Swelling of the abdomen that is new, acute  Fever of 100F or higher   For urgent or emergent issues, a gastroenterologist can be reached at any hour by calling [phone]. Please read all handouts given to you by your recovery nurse today.  DIET:  We do recommend a small meal at first, but then you may proceed to your regular diet.  Drink plenty of fluids but you should avoid alcoholic beverages for 24 hours.  ACTIVITY:  You should plan to take it easy for the rest of today and you should NOT DRIVE or use heavy machinery until  tomorrow (because of the sedation medicines used during the test).    FOLLOW UP: Our staff will call the number listed on your records the next business day following your procedure to check on you and address any questions or concerns that you may have regarding the information given to you following your procedure. If we do not reach you, we will leave a message.  However, if you are feeling well and you are not experiencing any problems, there is no need to return our call.  We will assume that you have returned to your regular daily activities without incident.  If any biopsies were taken you will be contacted by phone or by letter within the next 1-3 weeks.  Please call us at [phone] if you have not heard about the biopsies in 3 weeks.    SIGNATURES/CONFIDENTIALITY: You and/or your care partner have signed paperwork which will be entered into your electronic medical record.  These signatures attest to the fact that that the information above on your After Visit Summary has been reviewed and is understood.  Full responsibility of the confidentiality of this discharge information lies with you and/or your care-partner.  Thank you for letting us take care of your healthcare needs today. 

## 2016-09-22 ENCOUNTER — Telehealth: Payer: Self-pay | Admitting: *Deleted

## 2016-09-22 NOTE — Telephone Encounter (Signed)
No answer. Number identifier. Message left to call if questions or concerns and we will make an additional attempt later in the day to reach.

## 2016-09-22 NOTE — Telephone Encounter (Signed)
  Follow up Call-  Call back number 09/21/2016  Post procedure Call Back phone  # [phone] cell  Permission to leave phone message Yes  Some recent data might be hidden     Patient questions:  Do you have a fever, pain , or abdominal swelling? No. Pain Score  0 *  Have you tolerated food without any problems? Yes.    Have you been able to return to your normal activities? Yes.    Do you have any questions about your discharge instructions: Diet   No. Medications  No. Follow up visit  No.  Do you have questions or concerns about your Care? No.  Actions: * If pain score is 4 or above: No action needed, pain <4.

## 2016-09-29 ENCOUNTER — Encounter: Payer: Self-pay | Admitting: Gastroenterology

## 2016-11-20 NOTE — Progress Notes (Signed)
ANNUAL PREVENTATIVE CARE GYN  ENCOUNTER NOTE  Subjective:       Jessica Frey is a 61 y.o.G2 P74 female here for a routine annual gynecologic exam.  Current complaints: 1.  none 2. Vaginal atrophy- using natural lubricant  No major interval health issues. Colonoscopy this year revealed one polyp. Bowel and bladder function are normal. Patient is exercising regularly. Major stressors include childrearing.   Gynecologic History No LMP recorded. Patient is postmenopausal. Contraception: post menopausal status Last Pap: 2017 n/n. Results were: normal Last mammogram: 2017 birad 1. Results were: normal  Obstetric History OB History  No data available    Past Medical History:  Diagnosis Date  . Hot flashes   . Menopause   . Osteopenia   . Vaginal atrophy     Past Surgical History:  Procedure Laterality Date  . CESAREAN SECTION     [id number]  . COLONOSCOPY    . HERNIA REPAIR  1959  . WISDOM TOOTH EXTRACTION      Current Outpatient Prescriptions on File Prior to Visit  Medication Sig Dispense Refill  . calcium-vitamin D 250-100 MG-UNIT per tablet Take 1 tablet by mouth 2 (two) times daily.    . Collagen 500 MG CAPS Take by mouth.    Marland Kitchen geriatric multivitamins-minerals (ELDERTONIC/GEVRABON) ELIX Take 15 mLs by mouth daily.    Marland Kitchen OVER THE COUNTER MEDICATION Nueve vaginal lubricant. 1 application 1 time a week.     Current Facility-Administered Medications on File Prior to Visit  Medication Dose Route Frequency Provider Last Rate Last Dose  . 0.9 %  sodium chloride infusion  500 mL Intravenous Continuous Armbruster, Carlota Raspberry, MD        No Known Allergies  Social History   Social History  . Marital status: Married    Spouse name: N/A  . Number of children: N/A  . Years of education: N/A   Occupational History  . Not on file.   Social History Main Topics  . Smoking status: Never Smoker  . Smokeless tobacco: Never Used  . Alcohol use No  . Drug use: No  . Sexual  activity: Yes    Birth control/ protection: Post-menopausal   Other Topics Concern  . Not on file   Social History Narrative  . No narrative on file    Family History  Problem Relation Age of Onset  . Heart disease Father   . Cancer Neg Hx   . Breast cancer Neg Hx   . Colon cancer Neg Hx   . Esophageal cancer Neg Hx   . Rectal cancer Neg Hx   . Stomach cancer Neg Hx   . Pancreatic cancer Neg Hx     The following portions of the patient's history were reviewed and updated as appropriate: allergies, current medications, past family history, past medical history, past social history, past surgical history and problem list.  Review of Systems Review of Systems  Constitutional: Negative.   HENT: Negative.   Eyes: Negative.   Respiratory: Negative.   Cardiovascular: Negative.   Gastrointestinal: Negative.   Genitourinary:       Vaginal dryness symptoms persist and are being treated with Coconut oil  Musculoskeletal: Negative.   Skin: Negative.   Neurological: Negative.   Endo/Heme/Allergies: Negative.   Psychiatric/Behavioral: Negative.      Objective:   BP 132/75   Pulse 88   Ht 5\' 2"  (1.575 m)   Wt 133 lb (60.3 kg)   BMI 24.33 kg/m  CONSTITUTIONAL: Well-developed, well-nourished  female in no acute distress.  PSYCHIATRIC: Normal mood and affect. Normal behavior. Normal judgment and thought content. [place]: Alert and oriented to person, place, and time. Normal muscle tone coordination. No cranial nerve deficit noted. HENT:  Normocephalic, atraumatic, External right and left ear normal. Oropharynx is clear and moist EYES: Conjunctivae and EOM are normal. Pupils are equal, round, and reactive to light. No scleral icterus.  NECK: Normal range of motion, supple, no masses.  Normal thyroid.  SKIN: Skin is warm and dry. No rash noted. Not diaphoretic. No erythema. No pallor. CARDIOVASCULAR: Normal heart rate noted, regular rhythm, no murmur. RESPIRATORY: Clear to  auscultation bilaterally. Effort and breath sounds normal, no problems with respiration noted. BREASTS: Symmetric in size. No masses, skin changes, nipple drainage, or lymphadenopathy. ABDOMEN: Soft, normal bowel sounds, no distention noted.  No tenderness, rebound or guarding.  BLADDER: Normal PELVIC:  External Genitalia: Normal  BUS: Normal  Vagina: Moderate to severe vaginal atrophy; single digit exam performed   Cervix: Normal; No lesions  Uterus: Normal; Midplane, small, mobile, nontender  Adnexa: Normal  RV: External Exam NormaI, No Rectal Masses and Normal Sphincter tone  MUSCULOSKELETAL: Normal range of motion. No tenderness.  No cyanosis, clubbing, or edema.  2+ distal pulses. LYMPHATIC: No Axillary, Supraclavicular, or Inguinal Adenopathy.    Assessment:   Annual gynecologic examination 61 y.o. Contraception: post menopausal status Normal BMI Vaginal atrophy  Recent colonoscopy showed one polyp  Plan:  Pap: Due 2020 Mammogram: Ordered Stool Guaiac Testing: colonoscopy 09/2016 wnl-  Labs: Thru employer Routine preventative health maintenance measures emphasized: Exercise/Diet/Weight control, Tobacco Warnings and Alcohol/Substance use risks Return to [place], [place]   Note: This dictation was prepared with Dragon dictation along with smaller phrase technology. Any transcriptional errors that result from this process are unintentional.

## 2016-11-21 ENCOUNTER — Encounter: Payer: Self-pay | Admitting: Obstetrics and Gynecology

## 2016-11-21 ENCOUNTER — Ambulatory Visit (INDEPENDENT_AMBULATORY_CARE_PROVIDER_SITE_OTHER): Payer: BLUE CROSS/BLUE SHIELD | Admitting: Obstetrics and Gynecology

## 2016-11-21 VITALS — BP 132/75 | HR 88 | Ht 62.0 in | Wt 133.0 lb

## 2016-11-21 DIAGNOSIS — Z01419 Encounter for gynecological examination (general) (routine) without abnormal findings: Secondary | ICD-10-CM | POA: Diagnosis not present

## 2016-11-21 DIAGNOSIS — Z1231 Encounter for screening mammogram for malignant neoplasm of breast: Secondary | ICD-10-CM

## 2016-11-21 DIAGNOSIS — N952 Postmenopausal atrophic vaginitis: Secondary | ICD-10-CM | POA: Diagnosis not present

## 2016-11-21 DIAGNOSIS — Z1239 Encounter for other screening for malignant neoplasm of breast: Secondary | ICD-10-CM

## 2016-11-21 DIAGNOSIS — Z1211 Encounter for screening for malignant neoplasm of colon: Secondary | ICD-10-CM | POA: Diagnosis not present

## 2016-11-21 NOTE — Patient Instructions (Addendum)
1. No Pap smear. Next Pap is due in 2020. 2. Mammogram ordered 3. Stool guaiac cards are not given for colon cancer screening due to recent colonoscopy this year 4. Screening labs are obtained through employer 5. Continue with healthy eating and exercise. 6. Continue with calcium and vitamin D supplementation 1200 mg/800 international units daily 7. Consider trial of Intrarosa suppositories for vaginal atrophy 8. Return in 1 year for annual exam   Health Maintenance for Postmenopausal Women Menopause is a normal process in which your reproductive ability comes to an end. This process happens gradually over a span of months to years, usually between the ages of 43 and 38. Menopause is complete when you have missed 12 consecutive menstrual periods. It is important to talk with your health care provider about some of the most common conditions that affect postmenopausal women, such as heart disease, cancer, and bone loss (osteoporosis). Adopting a healthy lifestyle and getting preventive care can help to promote your health and wellness. Those actions can also lower your chances of developing some of these common conditions. What should I know about menopause? During menopause, you may experience a number of symptoms, such as:  Moderate-to-severe hot flashes.  Night sweats.  Decrease in sex drive.  Mood swings.  Headaches.  Tiredness.  Irritability.  Memory problems.  Insomnia.  Choosing to treat or not to treat menopausal changes is an individual decision that you make with your health care provider. What should I know about hormone replacement therapy and supplements? Hormone therapy products are effective for treating symptoms that are associated with menopause, such as hot flashes and night sweats. Hormone replacement carries certain risks, especially as you become older. If you are thinking about using estrogen or estrogen with progestin treatments, discuss the benefits and  risks with your health care provider. What should I know about heart disease and stroke? Heart disease, heart attack, and stroke become more likely as you age. This may be due, in part, to the hormonal changes that your body experiences during menopause. These can affect how your body processes dietary fats, triglycerides, and cholesterol. Heart attack and stroke are both medical emergencies. There are many things that you can do to help prevent heart disease and stroke:  Have your blood pressure checked at least every 1-2 years. High blood pressure causes heart disease and increases the risk of stroke.  If you are 66-65 years old, ask your health care provider if you should take aspirin to prevent a heart attack or a stroke.  Do not use any tobacco products, including cigarettes, chewing tobacco, or electronic cigarettes. If you need help quitting, ask your health care provider.  It is important to eat a healthy diet and maintain a healthy weight. ? Be sure to include plenty of vegetables, fruits, low-fat dairy products, and lean protein. ? Avoid eating foods that are high in solid fats, added sugars, or salt (sodium).  Get regular exercise. This is one of the most important things that you can do for your health. ? Try to exercise for at least 150 minutes each week. The type of exercise that you do should increase your heart rate and make you sweat. This is known as moderate-intensity exercise. ? Try to do strengthening exercises at least twice each week. Do these in addition to the moderate-intensity exercise.  Know your numbers.Ask your health care provider to check your cholesterol and your blood glucose. Continue to have your blood tested as directed by your health care  provider.  What should I know about cancer screening? There are several types of cancer. Take the following steps to reduce your risk and to catch any cancer development as early as possible. Breast Cancer  Practice  breast self-awareness. ? This means understanding how your breasts normally appear and feel. ? It also means doing regular breast self-exams. Let your health care provider know about any changes, no matter how small.  If you are 55 or older, have a clinician do a breast exam (clinical breast exam or CBE) every year. Depending on your age, family history, and medical history, it may be recommended that you also have a yearly breast X-ray (mammogram).  If you have a family history of breast cancer, talk with your health care provider about genetic screening.  If you are at high risk for breast cancer, talk with your health care provider about having an MRI and a mammogram every year.  Breast cancer (BRCA) gene test is recommended for women who have family members with BRCA-related cancers. Results of the assessment will determine the need for genetic counseling and BRCA1 and for BRCA2 testing. BRCA-related cancers include these types: ? Breast. This occurs in males or females. ? Ovarian. ? Tubal. This may also be called fallopian tube cancer. ? Cancer of the abdominal or pelvic lining (peritoneal cancer). ? Prostate. ? Pancreatic.  Cervical, Uterine, and Ovarian Cancer Your health care provider may recommend that you be screened regularly for cancer of the pelvic organs. These include your ovaries, uterus, and vagina. This screening involves a pelvic exam, which includes checking for microscopic changes to the surface of your cervix (Pap test).  For women ages 21-65, health care providers may recommend a pelvic exam and a Pap test every three years. For women ages 51-65, they may recommend the Pap test and pelvic exam, combined with testing for human papilloma virus (HPV), every five years. Some types of HPV increase your risk of cervical cancer. Testing for HPV may also be done on women of any age who have unclear Pap test results.  Other health care providers may not recommend any screening  for nonpregnant women who are considered low risk for pelvic cancer and have no symptoms. Ask your health care provider if a screening pelvic exam is right for you.  If you have had past treatment for cervical cancer or a condition that could lead to cancer, you need Pap tests and screening for cancer for at least 20 years after your treatment. If Pap tests have been discontinued for you, your risk factors (such as having a new sexual partner) need to be reassessed to determine if you should start having screenings again. Some women have medical problems that increase the chance of getting cervical cancer. In these cases, your health care provider may recommend that you have screening and Pap tests more often.  If you have a family history of uterine cancer or ovarian cancer, talk with your health care provider about genetic screening.  If you have vaginal bleeding after reaching menopause, tell your health care provider.  There are currently no reliable tests available to screen for ovarian cancer.  Lung Cancer Lung cancer screening is recommended for adults 37-56 years old who are at high risk for lung cancer because of a history of smoking. A yearly low-dose CT scan of the lungs is recommended if you:  Currently smoke.  Have a history of at least 30 pack-years of smoking and you currently smoke or have quit within  the past 15 years. A pack-year is smoking an average of one pack of cigarettes per day for one year.  Yearly screening should:  Continue until it has been 15 years since you quit.  Stop if you develop a health problem that would prevent you from having lung cancer treatment.  Colorectal Cancer  This type of cancer can be detected and can often be prevented.  Routine colorectal cancer screening usually begins at age 65 and continues through age 74.  If you have risk factors for colon cancer, your health care provider may recommend that you be screened at an earlier age.  If  you have a family history of colorectal cancer, talk with your health care provider about genetic screening.  Your health care provider may also recommend using home test kits to check for hidden blood in your stool.  A small camera at the end of a tube can be used to examine your colon directly (sigmoidoscopy or colonoscopy). This is done to check for the earliest forms of colorectal cancer.  Direct examination of the colon should be repeated every 5-10 years until age 33. However, if early forms of precancerous polyps or small growths are found or if you have a family history or genetic risk for colorectal cancer, you may need to be screened more often.  Skin Cancer  Check your skin from head to toe regularly.  Monitor any moles. Be sure to tell your health care provider: ? About any new moles or changes in moles, especially if there is a change in a mole's shape or color. ? If you have a mole that is larger than the size of a pencil eraser.  If any of your family members has a history of skin cancer, especially at a young age, talk with your health care provider about genetic screening.  Always use sunscreen. Apply sunscreen liberally and repeatedly throughout the day.  Whenever you are outside, protect yourself by wearing long sleeves, pants, a wide-brimmed hat, and sunglasses.  What should I know about osteoporosis? Osteoporosis is a condition in which bone destruction happens more quickly than new bone creation. After menopause, you may be at an increased risk for osteoporosis. To help prevent osteoporosis or the bone fractures that can happen because of osteoporosis, the following is recommended:  If you are 52-85 years old, get at least 1,000 mg of calcium and at least 600 mg of vitamin D per day.  If you are older than age 12 but younger than age 22, get at least 1,200 mg of calcium and at least 600 mg of vitamin D per day.  If you are older than age 16, get at least 1,200 mg of  calcium and at least 800 mg of vitamin D per day.  Smoking and excessive alcohol intake increase the risk of osteoporosis. Eat foods that are rich in calcium and vitamin D, and do weight-bearing exercises several times each week as directed by your health care provider. What should I know about how menopause affects my mental health? Depression may occur at any age, but it is more common as you become older. Common symptoms of depression include:  Low or sad mood.  Changes in sleep patterns.  Changes in appetite or eating patterns.  Feeling an overall lack of motivation or enjoyment of activities that you previously enjoyed.  Frequent crying spells.  Talk with your health care provider if you think that you are experiencing depression. What should I know about immunizations? It  is important that you get and maintain your immunizations. These include:  Tetanus, diphtheria, and pertussis (Tdap) booster vaccine.  Influenza every year before the flu season begins.  Pneumonia vaccine.  Shingles vaccine.  Your health care provider may also recommend other immunizations. This information is not intended to replace advice given to you by your health care provider. Make sure you discuss any questions you have with your health care provider. Document Released: 04/21/2005 Document Revised: 09/17/2015 Document Reviewed: 12/01/2014 Elsevier Interactive Patient Education  2018 [employer].

## 2016-12-13 ENCOUNTER — Ambulatory Visit
Admission: RE | Admit: 2016-12-13 | Discharge: 2016-12-13 | Disposition: A | Discharge status: Home / Self Care | Payer: BLUE CROSS/BLUE SHIELD | Source: Ambulatory Visit | Attending: Obstetrics and Gynecology | Admitting: Obstetrics and Gynecology

## 2016-12-13 DIAGNOSIS — Z1231 Encounter for screening mammogram for malignant neoplasm of breast: Secondary | ICD-10-CM | POA: Diagnosis not present

## 2016-12-13 DIAGNOSIS — Z01419 Encounter for gynecological examination (general) (routine) without abnormal findings: Secondary | ICD-10-CM

## 2017-02-20 DIAGNOSIS — L821 Other seborrheic keratosis: Secondary | ICD-10-CM | POA: Diagnosis not present

## 2017-02-20 DIAGNOSIS — L812 Freckles: Secondary | ICD-10-CM | POA: Diagnosis not present

## 2017-02-20 DIAGNOSIS — L578 Other skin changes due to chronic exposure to nonionizing radiation: Secondary | ICD-10-CM | POA: Diagnosis not present

## 2017-02-20 DIAGNOSIS — L820 Inflamed seborrheic keratosis: Secondary | ICD-10-CM | POA: Diagnosis not present

## 2017-07-11 DIAGNOSIS — L821 Other seborrheic keratosis: Secondary | ICD-10-CM | POA: Diagnosis not present

## 2017-07-11 DIAGNOSIS — L820 Inflamed seborrheic keratosis: Secondary | ICD-10-CM | POA: Diagnosis not present

## 2017-07-11 DIAGNOSIS — L812 Freckles: Secondary | ICD-10-CM | POA: Diagnosis not present

## 2017-07-11 DIAGNOSIS — L578 Other skin changes due to chronic exposure to nonionizing radiation: Secondary | ICD-10-CM | POA: Diagnosis not present

## 2017-11-13 ENCOUNTER — Other Ambulatory Visit: Payer: Self-pay | Admitting: Obstetrics and Gynecology

## 2017-11-13 DIAGNOSIS — Z1231 Encounter for screening mammogram for malignant neoplasm of breast: Secondary | ICD-10-CM

## 2017-11-23 NOTE — Progress Notes (Signed)
ANNUAL PREVENTATIVE CARE GYN  ENCOUNTER NOTE  Subjective:       Jessica Frey is a 62 y.o.G2 P95 female here for a routine annual gynecologic exam.  Current complaints:  1.  none 2. Vaginal atrophy-patient is using coconut oil with reasonable success.  She is not interested in any vaginal estrogen cream. 3. Flu vaccine  Given  Recent colonoscopy showed 1 polyp; recommendation is for repeat colonoscopy in 5 years-next colonoscopy is due 2023     Gynecologic History No LMP recorded. Patient is postmenopausal. Contraception: post menopausal status Last Pap: 2017 n/n. Results were: normal Last mammogram: 12/13/2016  birad 1. Results were: normal Last Colonoscopy- 09/2016-1 polyp noted; next colonoscopy due 2023  Obstetric History OB History  Gravida Para Term Preterm AB Living  2 2 2     2   SAB TAB Ectopic Multiple Live Births          2    # Outcome Date GA Lbr Len/2nd Weight Sex Delivery Anes PTL Lv  2 Term 1992    F CS-LTranv   LIV  1 Term 1989    M CS-LTranv   LIV    Past Medical History:  Diagnosis Date  . Hot flashes   . Menopause   . Osteopenia   . Vaginal atrophy     Past Surgical History:  Procedure Laterality Date  . CESAREAN SECTION     [id number]  . COLONOSCOPY    . HERNIA REPAIR  1959  . WISDOM TOOTH EXTRACTION      Current Outpatient Medications on File Prior to Visit  Medication Sig Dispense Refill  . calcium-vitamin D 250-100 MG-UNIT per tablet Take 1 tablet by mouth 2 (two) times daily.    . Collagen 500 MG CAPS Take by mouth.    Marland Kitchen geriatric multivitamins-minerals (ELDERTONIC/GEVRABON) ELIX Take 15 mLs by mouth daily.    Marland Kitchen OVER THE COUNTER MEDICATION Nueve vaginal lubricant. 1 application 1 time a week.     No current facility-administered medications on file prior to visit.     No Known Allergies  Social History   Socioeconomic History  . Marital status: Married    Spouse name: Not on file  . Number of children: Not on file  . Years of  education: Not on file  . Highest education level: Not on file  Occupational History  . Not on file  Social Needs  . Financial resource strain: Not on file  . Food insecurity:    Worry: Not on file    Inability: Not on file  . Transportation needs:    Medical: Not on file    Non-medical: Not on file  Tobacco Use  . Smoking status: Never Smoker  . Smokeless tobacco: Never Used  Substance and Sexual Activity  . Alcohol use: No  . Drug use: No  . Sexual activity: Yes    Birth control/protection: Post-menopausal  Lifestyle  . Physical activity:    Days per week: Not on file    Minutes per session: Not on file  . Stress: Not on file  Relationships  . Social connections:    Talks on phone: Not on file    Gets together: Not on file    Attends religious service: Not on file    Active member of club or organization: Not on file    Attends meetings of clubs or organizations: Not on file    Relationship status: Not on file  . Intimate partner violence:  Fear of current or ex partner: Not on file    Emotionally abused: Not on file    Physically abused: Not on file    Forced sexual activity: Not on file  Other Topics Concern  . Not on file  Social History Narrative  . Not on file    Family History  Problem Relation Age of Onset  . Heart disease Father   . Cancer Neg Hx   . Breast cancer Neg Hx   . Colon cancer Neg Hx   . Esophageal cancer Neg Hx   . Rectal cancer Neg Hx   . Stomach cancer Neg Hx   . Pancreatic cancer Neg Hx     The following portions of the patient's history were reviewed and updated as appropriate: allergies, current medications, past family history, past medical history, past social history, past surgical history and problem list.  Review of Systems  Review of Systems  Constitutional: Negative.   HENT: Negative.   Eyes: Negative.   Respiratory: Negative.   Cardiovascular: Negative.   Gastrointestinal: Negative.   Genitourinary: Negative.    Musculoskeletal: Negative.   Skin: Negative.   Neurological: Negative.   Endo/Heme/Allergies: Negative.   Psychiatric/Behavioral: Negative.     Objective:   BP (!) 154/79   Pulse 77   Ht 5\' 2"  (1.575 m)   Wt 131 lb (59.4 kg)   BMI 23.96 kg/m  CONSTITUTIONAL: Well-developed, well-nourished female in no acute distress.  PSYCHIATRIC: Normal mood and affect. Normal behavior. Normal judgment and thought content. [place]: Alert and oriented to person, place, and time. Normal muscle tone coordination. No cranial nerve deficit noted. HENT:  Normocephalic, atraumatic, External right and left ear normal.  EYES: Conjunctivae and EOM are normal. No scleral icterus.  NECK: Normal range of motion, supple, no masses.  Normal thyroid.  SKIN: Skin is warm and dry. No rash noted. Not diaphoretic. No erythema. No pallor. CARDIOVASCULAR: Normal heart rate noted, regular rhythm, no murmur. RESPIRATORY: Clear to auscultation bilaterally. Effort and breath sounds normal, no problems with respiration noted. BREASTS: Symmetric in size. No masses, skin changes, nipple drainage, or lymphadenopathy. ABDOMEN: Soft, normal bowel sounds, no distention noted.  No tenderness, rebound or guarding.  BLADDER: Normal PELVIC:   External Genitalia: Normal  BUS: Small urethral caruncle  Vagina: Moderate to severe vaginal atrophy; single digit exam performed   Cervix: Normal; No lesions  Uterus: Normal; Midplane, small, mobile, nontender  Adnexa: Normal  RV: External Exam NormaI, No Rectal Masses and Normal Sphincter tone  MUSCULOSKELETAL: Normal range of motion. No tenderness.  No cyanosis, clubbing, or edema.  2+ distal pulses. LYMPHATIC: No Axillary, Supraclavicular, or Inguinal Adenopathy.    Assessment:   Annual gynecologic examination 62 y.o. Contraception: post menopausal status Normal BMI Vaginal atrophy, stable Recent colonoscopy showed one polyp; next colonoscopy due in 2023 Urethral caruncle,  asymptomatic  Plan:  Pap: Due 2020 Mammogram: Ordered- scheduled for 12/17/2017 Stool Guaiac Testing: ordered Labs: Thru employer Routine preventative health maintenance measures emphasized: Exercise/Diet/Weight control, Tobacco Warnings and Alcohol/Substance use risks  Lubricants discussed: Coconut oil or Jo H2O lubricant recommended. Return to [place], [place]  Brayton Mars, MD   Note: This dictation was prepared with Dragon dictation along with smaller phrase technology. Any transcriptional errors that result from this process are unintentional.

## 2017-11-27 ENCOUNTER — Ambulatory Visit (INDEPENDENT_AMBULATORY_CARE_PROVIDER_SITE_OTHER): Payer: BLUE CROSS/BLUE SHIELD | Admitting: Obstetrics and Gynecology

## 2017-11-27 ENCOUNTER — Encounter: Payer: Self-pay | Admitting: Obstetrics and Gynecology

## 2017-11-27 VITALS — BP 154/79 | HR 77 | Ht 62.0 in | Wt 131.0 lb

## 2017-11-27 DIAGNOSIS — N362 Urethral caruncle: Secondary | ICD-10-CM | POA: Insufficient documentation

## 2017-11-27 DIAGNOSIS — Z23 Encounter for immunization: Secondary | ICD-10-CM

## 2017-11-27 DIAGNOSIS — Z1231 Encounter for screening mammogram for malignant neoplasm of breast: Secondary | ICD-10-CM | POA: Diagnosis not present

## 2017-11-27 DIAGNOSIS — N952 Postmenopausal atrophic vaginitis: Secondary | ICD-10-CM

## 2017-11-27 DIAGNOSIS — Z01411 Encounter for gynecological examination (general) (routine) with abnormal findings: Secondary | ICD-10-CM | POA: Diagnosis not present

## 2017-11-27 DIAGNOSIS — Z01419 Encounter for gynecological examination (general) (routine) without abnormal findings: Secondary | ICD-10-CM

## 2017-11-27 DIAGNOSIS — Z1211 Encounter for screening for malignant neoplasm of colon: Secondary | ICD-10-CM | POA: Diagnosis not present

## 2017-11-27 DIAGNOSIS — Z1239 Encounter for other screening for malignant neoplasm of breast: Secondary | ICD-10-CM

## 2017-11-27 NOTE — Patient Instructions (Addendum)
1.  Pap smear is not done.  Next Pap smear is due in 2020 2.  Mammogram is due in October 2019 3.  Screening labs are done through primary care 4.  Stool guaiac cards are given for colon cancer screening 5.  Continue with calcium and vitamin D daily along with regular exercise 6.  Consider lubricants as follows:  Coconut oil  Olive oil  Jo H2O lubricant 7.  Return in 1 year for annual exam 8.  Flu vaccine is given   Health Maintenance for Postmenopausal Women Menopause is a normal process in which your reproductive ability comes to an end. This process happens gradually over a span of months to years, usually between the ages of 57 and 69. Menopause is complete when you have missed 12 consecutive menstrual periods. It is important to talk with your health care provider about some of the most common conditions that affect postmenopausal women, such as heart disease, cancer, and bone loss (osteoporosis). Adopting a healthy lifestyle and getting preventive care can help to promote your health and wellness. Those actions can also lower your chances of developing some of these common conditions. What should I know about menopause? During menopause, you may experience a number of symptoms, such as:  Moderate-to-severe hot flashes.  Night sweats.  Decrease in sex drive.  Mood swings.  Headaches.  Tiredness.  Irritability.  Memory problems.  Insomnia.  Choosing to treat or not to treat menopausal changes is an individual decision that you make with your health care provider. What should I know about hormone replacement therapy and supplements? Hormone therapy products are effective for treating symptoms that are associated with menopause, such as hot flashes and night sweats. Hormone replacement carries certain risks, especially as you become older. If you are thinking about using estrogen or estrogen with progestin treatments, discuss the benefits and risks with your health care  provider. What should I know about heart disease and stroke? Heart disease, heart attack, and stroke become more likely as you age. This may be due, in part, to the hormonal changes that your body experiences during menopause. These can affect how your body processes dietary fats, triglycerides, and cholesterol. Heart attack and stroke are both medical emergencies. There are many things that you can do to help prevent heart disease and stroke:  Have your blood pressure checked at least every 1-2 years. High blood pressure causes heart disease and increases the risk of stroke.  If you are 52-17 years old, ask your health care provider if you should take aspirin to prevent a heart attack or a stroke.  Do not use any tobacco products, including cigarettes, chewing tobacco, or electronic cigarettes. If you need help quitting, ask your health care provider.  It is important to eat a healthy diet and maintain a healthy weight. ? Be sure to include plenty of vegetables, fruits, low-fat dairy products, and lean protein. ? Avoid eating foods that are high in solid fats, added sugars, or salt (sodium).  Get regular exercise. This is one of the most important things that you can do for your health. ? Try to exercise for at least 150 minutes each week. The type of exercise that you do should increase your heart rate and make you sweat. This is known as moderate-intensity exercise. ? Try to do strengthening exercises at least twice each week. Do these in addition to the moderate-intensity exercise.  Know your numbers.Ask your health care provider to check your cholesterol and your blood  glucose. Continue to have your blood tested as directed by your health care provider.  What should I know about cancer screening? There are several types of cancer. Take the following steps to reduce your risk and to catch any cancer development as early as possible. Breast Cancer  Practice breast self-awareness. ? This  means understanding how your breasts normally appear and feel. ? It also means doing regular breast self-exams. Let your health care provider know about any changes, no matter how small.  If you are 56 or older, have a clinician do a breast exam (clinical breast exam or CBE) every year. Depending on your age, family history, and medical history, it may be recommended that you also have a yearly breast X-ray (mammogram).  If you have a family history of breast cancer, talk with your health care provider about genetic screening.  If you are at high risk for breast cancer, talk with your health care provider about having an MRI and a mammogram every year.  Breast cancer (BRCA) gene test is recommended for women who have family members with BRCA-related cancers. Results of the assessment will determine the need for genetic counseling and BRCA1 and for BRCA2 testing. BRCA-related cancers include these types: ? Breast. This occurs in males or females. ? Ovarian. ? Tubal. This may also be called fallopian tube cancer. ? Cancer of the abdominal or pelvic lining (peritoneal cancer). ? Prostate. ? Pancreatic.  Cervical, Uterine, and Ovarian Cancer Your health care provider may recommend that you be screened regularly for cancer of the pelvic organs. These include your ovaries, uterus, and vagina. This screening involves a pelvic exam, which includes checking for microscopic changes to the surface of your cervix (Pap test).  For women ages 21-65, health care providers may recommend a pelvic exam and a Pap test every three years. For women ages 2-65, they may recommend the Pap test and pelvic exam, combined with testing for human papilloma virus (HPV), every five years. Some types of HPV increase your risk of cervical cancer. Testing for HPV may also be done on women of any age who have unclear Pap test results.  Other health care providers may not recommend any screening for nonpregnant women who are  considered low risk for pelvic cancer and have no symptoms. Ask your health care provider if a screening pelvic exam is right for you.  If you have had past treatment for cervical cancer or a condition that could lead to cancer, you need Pap tests and screening for cancer for at least 20 years after your treatment. If Pap tests have been discontinued for you, your risk factors (such as having a new sexual partner) need to be reassessed to determine if you should start having screenings again. Some women have medical problems that increase the chance of getting cervical cancer. In these cases, your health care provider may recommend that you have screening and Pap tests more often.  If you have a family history of uterine cancer or ovarian cancer, talk with your health care provider about genetic screening.  If you have vaginal bleeding after reaching menopause, tell your health care provider.  There are currently no reliable tests available to screen for ovarian cancer.  Lung Cancer Lung cancer screening is recommended for adults 4-37 years old who are at high risk for lung cancer because of a history of smoking. A yearly low-dose CT scan of the lungs is recommended if you:  Currently smoke.  Have a history of at  least 30 pack-years of smoking and you currently smoke or have quit within the past 15 years. A pack-year is smoking an average of one pack of cigarettes per day for one year.  Yearly screening should:  Continue until it has been 15 years since you quit.  Stop if you develop a health problem that would prevent you from having lung cancer treatment.  Colorectal Cancer  This type of cancer can be detected and can often be prevented.  Routine colorectal cancer screening usually begins at age 74 and continues through age 56.  If you have risk factors for colon cancer, your health care provider may recommend that you be screened at an earlier age.  If you have a family history of  colorectal cancer, talk with your health care provider about genetic screening.  Your health care provider may also recommend using home test kits to check for hidden blood in your stool.  A small camera at the end of a tube can be used to examine your colon directly (sigmoidoscopy or colonoscopy). This is done to check for the earliest forms of colorectal cancer.  Direct examination of the colon should be repeated every 5-10 years until age 45. However, if early forms of precancerous polyps or small growths are found or if you have a family history or genetic risk for colorectal cancer, you may need to be screened more often.  Skin Cancer  Check your skin from head to toe regularly.  Monitor any moles. Be sure to tell your health care provider: ? About any new moles or changes in moles, especially if there is a change in a mole's shape or color. ? If you have a mole that is larger than the size of a pencil eraser.  If any of your family members has a history of skin cancer, especially at a young age, talk with your health care provider about genetic screening.  Always use sunscreen. Apply sunscreen liberally and repeatedly throughout the day.  Whenever you are outside, protect yourself by wearing long sleeves, pants, a wide-brimmed hat, and sunglasses.  What should I know about osteoporosis? Osteoporosis is a condition in which bone destruction happens more quickly than new bone creation. After menopause, you may be at an increased risk for osteoporosis. To help prevent osteoporosis or the bone fractures that can happen because of osteoporosis, the following is recommended:  If you are 37-86 years old, get at least 1,000 mg of calcium and at least 600 mg of vitamin D per day.  If you are older than age 9 but younger than age 10, get at least 1,200 mg of calcium and at least 600 mg of vitamin D per day.  If you are older than age 63, get at least 1,200 mg of calcium and at least 800 mg  of vitamin D per day.  Smoking and excessive alcohol intake increase the risk of osteoporosis. Eat foods that are rich in calcium and vitamin D, and do weight-bearing exercises several times each week as directed by your health care provider. What should I know about how menopause affects my mental health? Depression may occur at any age, but it is more common as you become older. Common symptoms of depression include:  Low or sad mood.  Changes in sleep patterns.  Changes in appetite or eating patterns.  Feeling an overall lack of motivation or enjoyment of activities that you previously enjoyed.  Frequent crying spells.  Talk with your health care provider if you  think that you are experiencing depression. What should I know about immunizations? It is important that you get and maintain your immunizations. These include:  Tetanus, diphtheria, and pertussis (Tdap) booster vaccine.  Influenza every year before the flu season begins.  Pneumonia vaccine.  Shingles vaccine.  Your health care provider may also recommend other immunizations. This information is not intended to replace advice given to you by your health care provider. Make sure you discuss any questions you have with your health care provider. Document Released: 04/21/2005 Document Revised: 09/17/2015 Document Reviewed: 12/01/2014 Elsevier Interactive Patient Education  2018 [employer].

## 2017-12-17 ENCOUNTER — Ambulatory Visit
Admission: RE | Admit: 2017-12-17 | Discharge: 2017-12-17 | Disposition: A | Discharge status: Home / Self Care | Payer: BLUE CROSS/BLUE SHIELD | Source: Ambulatory Visit | Attending: Obstetrics and Gynecology | Admitting: Obstetrics and Gynecology

## 2017-12-17 DIAGNOSIS — Z1231 Encounter for screening mammogram for malignant neoplasm of breast: Secondary | ICD-10-CM

## 2018-09-09 IMAGING — MG MM DIGITAL SCREENING BILAT W/ TOMO W/ CAD
9 of 12 series · 9 of 28 positions shown · non-contrast
Comparison: Previous exam(s).

CLINICAL DATA: Screening.

EXAM:
2D DIGITAL SCREENING BILATERAL MAMMOGRAM WITH CAD AND ADJUNCT TOMO

[R MLO synth-2D]
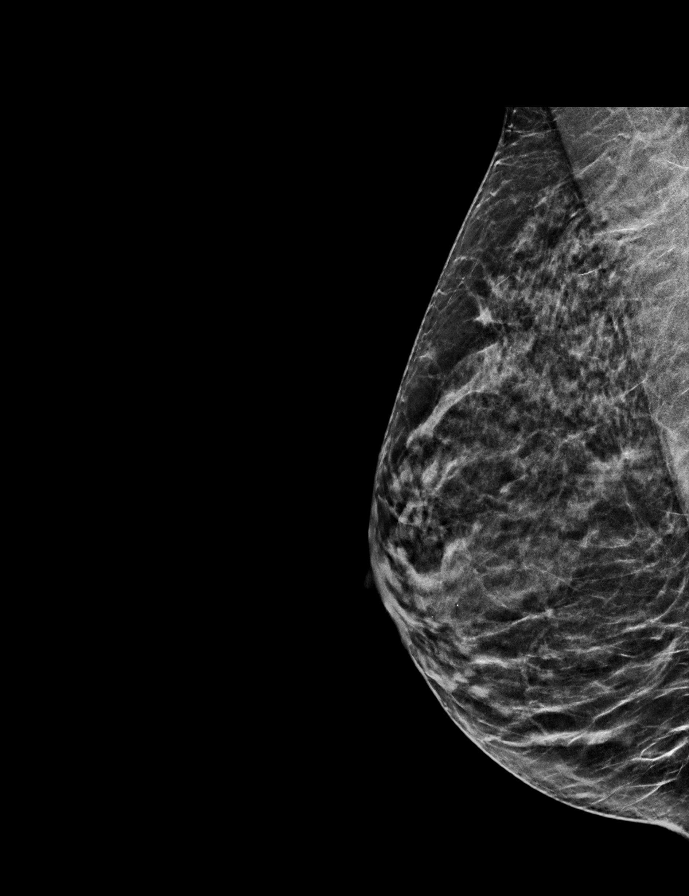

[L MLO synth-2D]
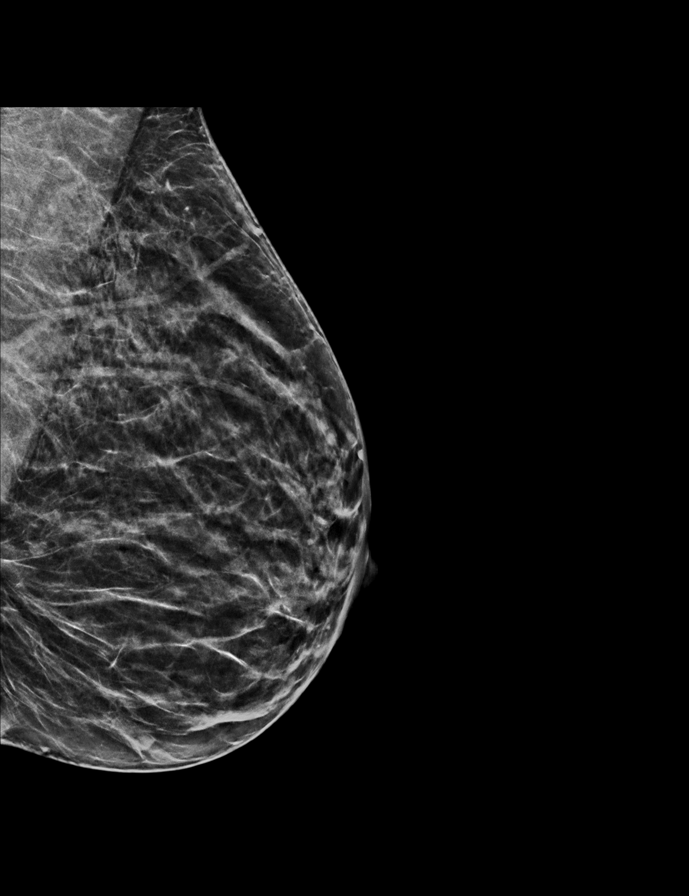

[L CC synth-2D]
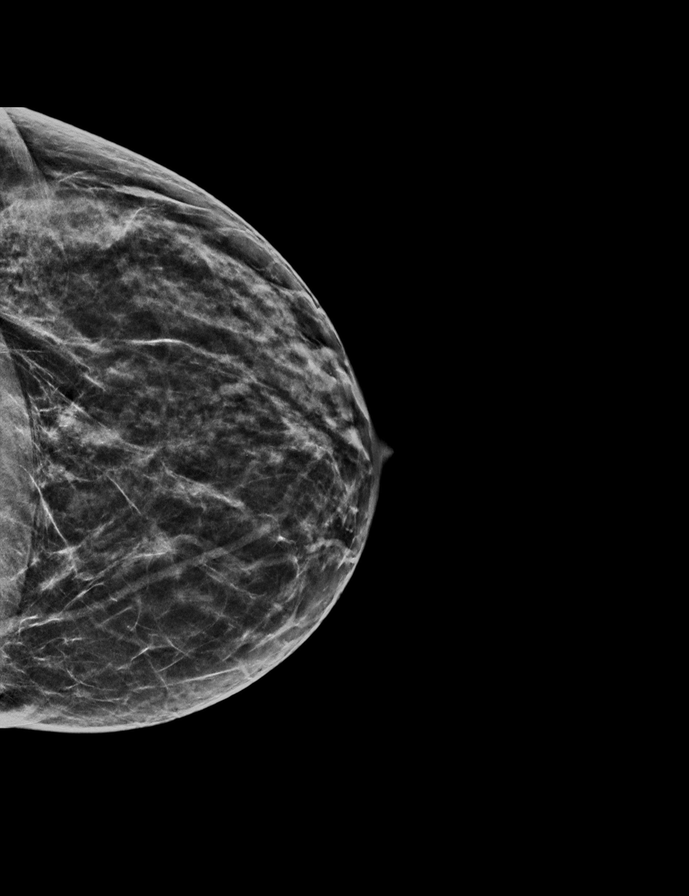

[R CC synth-2D]
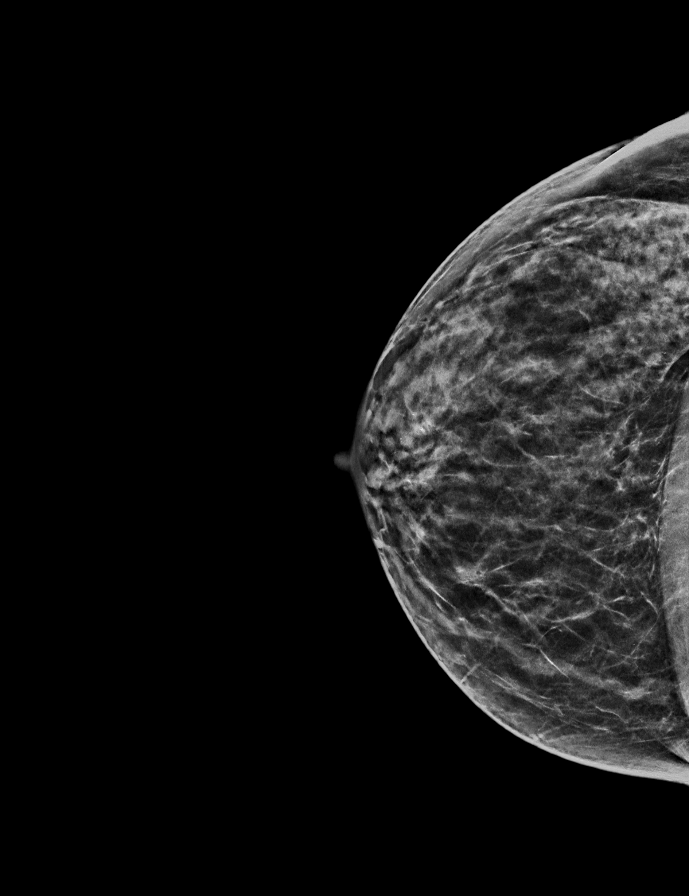

[R CC]
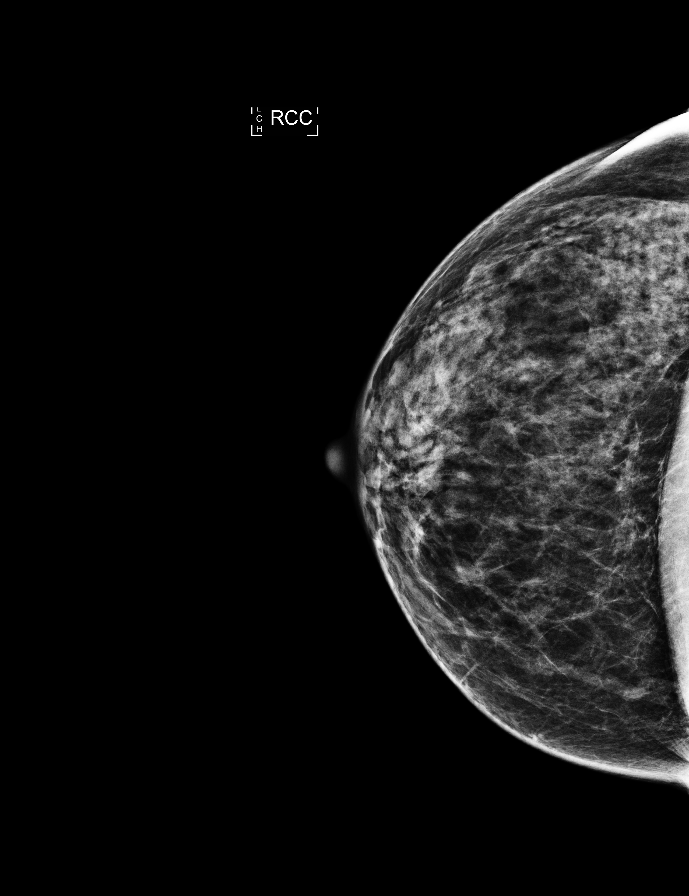

[L MLO]
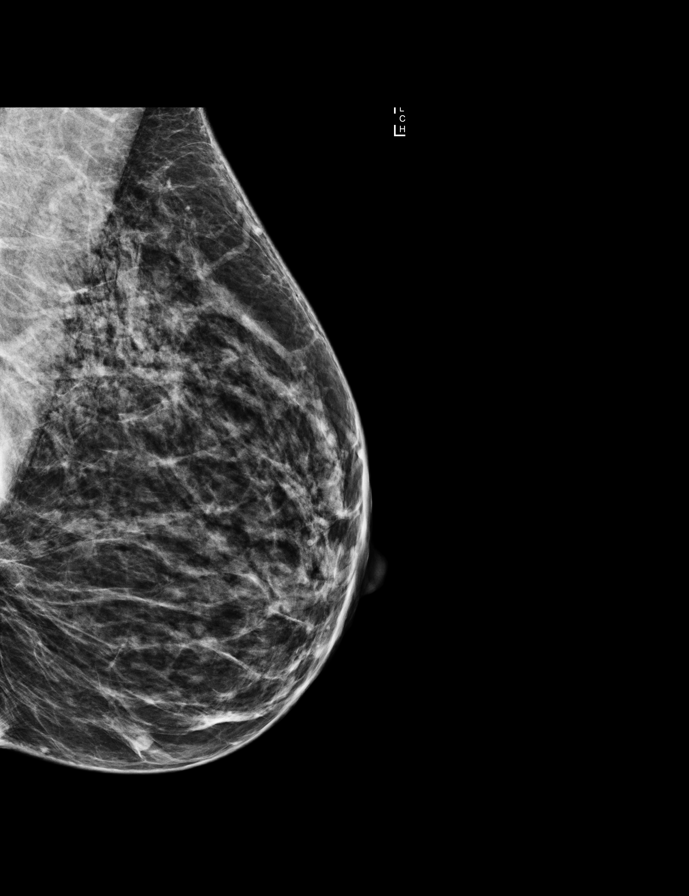

[R MLO]
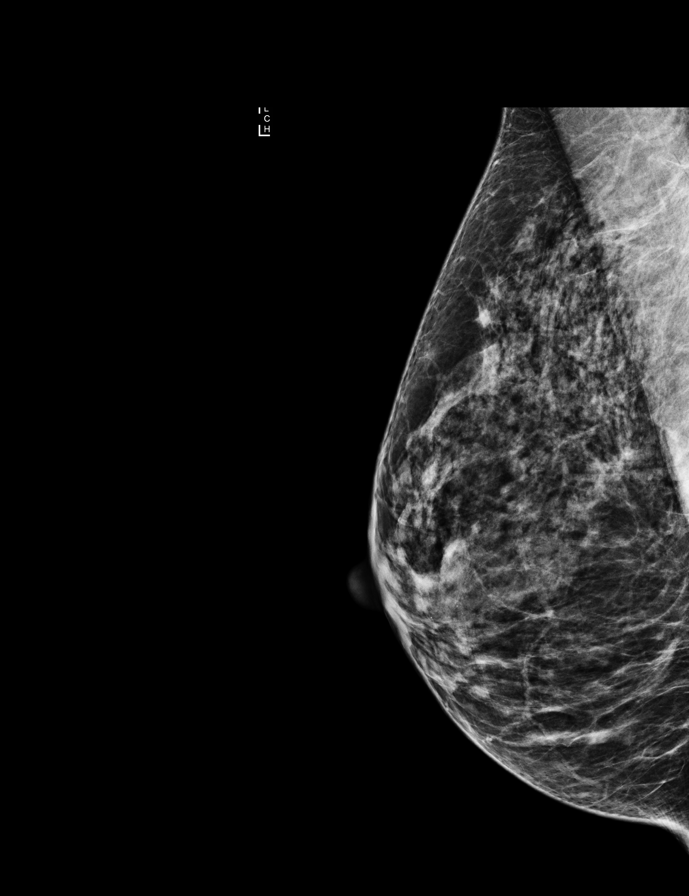

[L CC]
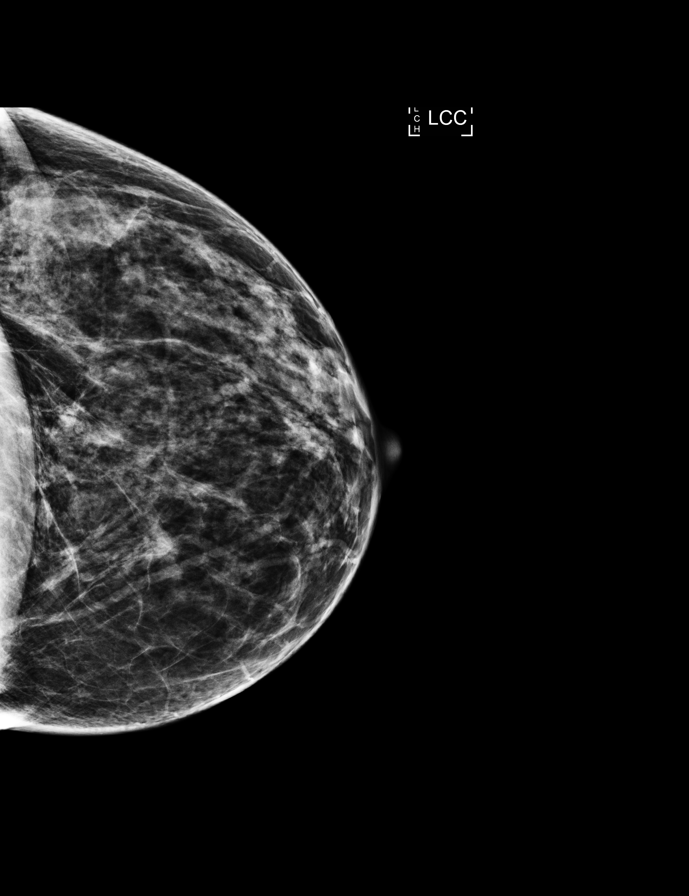

[R MLO tomo · tomo slice 27/52.0]
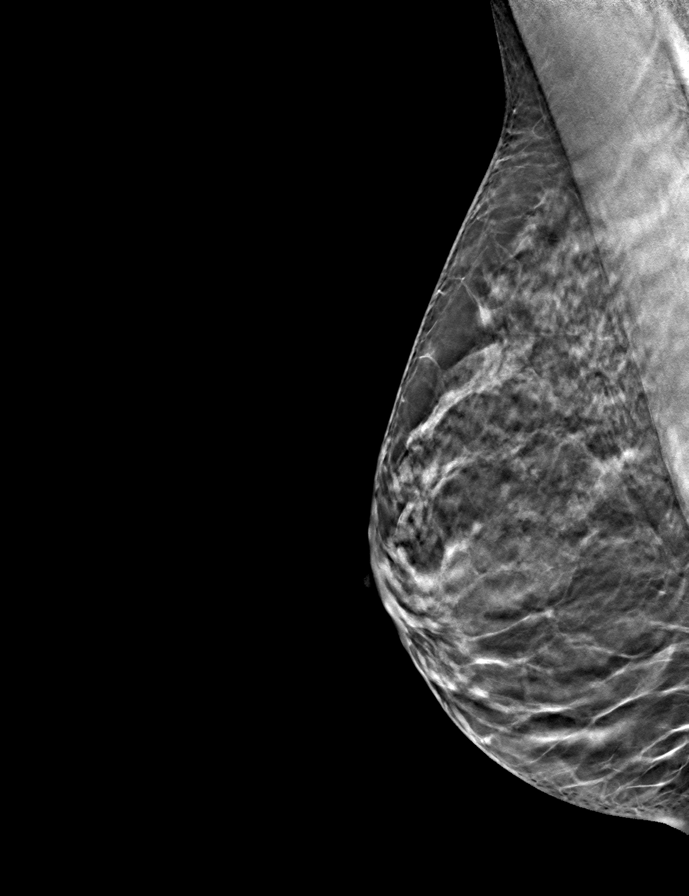

[9 of 28 positions shown; findings below may reference images not displayed]

ACR Breast Density Category c: The breast tissue is heterogeneously
dense, which may obscure small masses.
FINDINGS: There are no findings suspicious for malignancy. Images were
processed with CAD.
IMPRESSION: No mammographic evidence of malignancy. A result letter of this
screening mammogram will be mailed directly to the patient.

RECOMMENDATION:
Screening mammogram in one year. (Code:[id number])

BI-RADS CATEGORY  1: Negative.

## 2018-10-18 ENCOUNTER — Telehealth: Payer: Self-pay | Admitting: Obstetrics and Gynecology

## 2018-10-18 NOTE — Telephone Encounter (Signed)
Dr. Keturah Barre was her only doctor. Jessica Frey

## 2018-10-18 NOTE — Telephone Encounter (Signed)
Not sure why this patient would be seen there before a referral was placed.  I don't know if this patient has a PCP, or if Dr. Keturah Barre was her only doctor but doesn't sound like a GYN issue. I guess we can check and see if she has a PCP.  If not, we need a little more info about her knee issues as his notes don't mention anything about it.

## 2018-10-18 NOTE — Telephone Encounter (Signed)
I received a call from [facility]. The patient is being seen there currently for right knee pain, the provider is requesting  A referral for right knee pain for insurance purposes. Thanks.

## 2018-10-21 ENCOUNTER — Telehealth: Payer: Self-pay | Admitting: Obstetrics and Gynecology

## 2018-10-21 NOTE — Telephone Encounter (Signed)
Pt called and stated that she needs to speak with a nurse in regards to her needing to be referred to physical therapy by her other provider. The patient stated that when the office called they were informed that she was not a pt here at our office . Please advise.

## 2018-10-23 NOTE — Telephone Encounter (Signed)
Well I need to know a little bit more about her issues as I've never seen her before and I don't know what the referral is actually for or any of her symptoms.  I'm not sure how she was able to get in with PT without having a referral first.

## 2018-10-23 NOTE — Telephone Encounter (Signed)
Pearline Cables from PT in [place] will send over a request for the referral.   He needs this so her insurance will pay. Thanks.

## 2018-10-25 ENCOUNTER — Encounter: Payer: Self-pay | Admitting: Obstetrics and Gynecology

## 2018-10-25 NOTE — Telephone Encounter (Signed)
I know you guys were discussing this yesterday before I left.   Wanted to be sure if was resolved before I closed the message. ty  CM

## 2018-10-30 DIAGNOSIS — M25561 Pain in right knee: Secondary | ICD-10-CM | POA: Diagnosis not present

## 2018-11-06 DIAGNOSIS — M25561 Pain in right knee: Secondary | ICD-10-CM | POA: Diagnosis not present

## 2018-11-07 NOTE — Telephone Encounter (Signed)
Per [place] [facility]. cm

## 2018-11-13 DIAGNOSIS — M25561 Pain in right knee: Secondary | ICD-10-CM | POA: Diagnosis not present

## 2018-12-02 NOTE — Progress Notes (Signed)
Pt present for annual exam. Pt stated that she is doing well no problems. Pt had flu vaccine today.

## 2018-12-02 NOTE — Patient Instructions (Addendum)
Health Maintenance for Postmenopausal Women Menopause is a normal process in which your ability to get pregnant comes to an end. This process happens slowly over many months or years, usually between the ages of 48 and 55. Menopause is complete when you have missed your menstrual periods for 12 months. It is important to talk with your health care provider about some of the most common conditions that affect women after menopause (postmenopausal women). These include heart disease, cancer, and bone loss (osteoporosis). Adopting a healthy lifestyle and getting preventive care can help to promote your health and wellness. The actions you take can also lower your chances of developing some of these common conditions. What should I know about menopause? During menopause, you may get a number of symptoms, such as:  Hot flashes. These can be moderate or severe.  Night sweats.  Decrease in sex drive.  Mood swings.  Headaches.  Tiredness.  Irritability.  Memory problems.  Insomnia. Choosing to treat or not to treat these symptoms is a decision that you make with your health care provider. Do I need hormone replacement therapy?  Hormone replacement therapy is effective in treating symptoms that are caused by menopause, such as hot flashes and night sweats.  Hormone replacement carries certain risks, especially as you become older. If you are thinking about using estrogen or estrogen with progestin, discuss the benefits and risks with your health care provider. What is my risk for heart disease and stroke? The risk of heart disease, heart attack, and stroke increases as you age. One of the causes may be a change in the body's hormones during menopause. This can affect how your body uses dietary fats, triglycerides, and cholesterol. Heart attack and stroke are medical emergencies. There are many things that you can do to help prevent heart disease and stroke. Watch your blood pressure  High  blood pressure causes heart disease and increases the risk of stroke. This is more likely to develop in people who have high blood pressure readings, are of African descent, or are overweight.  Have your blood pressure checked: ? Every 3-5 years if you are 18-39 years of age. ? Every year if you are 40 years old or older. Eat a healthy diet   Eat a diet that includes plenty of vegetables, fruits, low-fat dairy products, and lean protein.  Do not eat a lot of foods that are high in solid fats, added sugars, or sodium. Get regular exercise Get regular exercise. This is one of the most important things you can do for your health. Most adults should:  Try to exercise for at least 150 minutes each week. The exercise should increase your heart rate and make you sweat (moderate-intensity exercise).  Try to do strengthening exercises at least twice each week. Do these in addition to the moderate-intensity exercise.  Spend less time sitting. Even light physical activity can be beneficial. Other tips  Work with your health care provider to achieve or maintain a healthy weight.  Do not use any products that contain nicotine or tobacco, such as cigarettes, e-cigarettes, and chewing tobacco. If you need help quitting, ask your health care provider.  Know your numbers. Ask your health care provider to check your cholesterol and your blood sugar (glucose). Continue to have your blood tested as directed by your health care provider. Do I need screening for cancer? Depending on your health history and family history, you may need to have cancer screening at different stages of your life. This   may include screening for:  Breast cancer.  Cervical cancer.  Lung cancer.  Colorectal cancer. What is my risk for osteoporosis? After menopause, you may be at increased risk for osteoporosis. Osteoporosis is a condition in which bone destruction happens more quickly than new bone creation. To help prevent  osteoporosis or the bone fractures that can happen because of osteoporosis, you may take the following actions:  If you are 19-22 years old, get at least 1,000 mg of calcium and at least 600 mg of vitamin D per day.  If you are older than age 80 but younger than age 49, get at least 1,200 mg of calcium and at least 600 mg of vitamin D per day.  If you are older than age 17, get at least 1,200 mg of calcium and at least 800 mg of vitamin D per day. Smoking and drinking excessive alcohol increase the risk of osteoporosis. Eat foods that are rich in calcium and vitamin D, and do weight-bearing exercises several times each week as directed by your health care provider. How does menopause affect my mental health? Depression may occur at any age, but it is more common as you become older. Common symptoms of depression include:  Low or sad mood.  Changes in sleep patterns.  Changes in appetite or eating patterns.  Feeling an overall lack of motivation or enjoyment of activities that you previously enjoyed.  Frequent crying spells. Talk with your health care provider if you think that you are experiencing depression. General instructions See your health care provider for regular wellness exams and vaccines. This may include:  Scheduling regular health, dental, and eye exams.  Getting and maintaining your vaccines. These include: ? Influenza vaccine. Get this vaccine each year before the flu season begins. ? Pneumonia vaccine. ? Shingles vaccine. ? Tetanus, diphtheria, and pertussis (Tdap) booster vaccine. Your health care provider may also recommend other immunizations. Tell your health care provider if you have ever been abused or do not feel safe at home. Summary  Menopause is a normal process in which your ability to get pregnant comes to an end.  This condition causes hot flashes, night sweats, decreased interest in sex, mood swings, headaches, or lack of sleep.  Treatment for this  condition may include hormone replacement therapy.  Take actions to keep yourself healthy, including exercising regularly, eating a healthy diet, watching your weight, and checking your blood pressure and blood sugar levels.  Get screened for cancer and depression. Make sure that you are up to date with all your vaccines. This information is not intended to replace advice given to you by your health care provider. Make sure you discuss any questions you have with your health care provider. Document Released: 04/21/2005 Document Revised: 02/20/2018 Document Reviewed: 02/20/2018 Elsevier Patient Education  [employer].   Influenza (Flu) Vaccine (Inactivated or Recombinant): What You Need to Know 1. Why get vaccinated? Influenza vaccine can prevent influenza (flu). Flu is a contagious disease that spreads around the [place] every year, usually between October and May. Anyone can get the flu, but it is more dangerous for some people. Infants and young children, people 55 years of age and older, pregnant women, and people with certain health conditions or a weakened immune system are at greatest risk of flu complications. Pneumonia, bronchitis, sinus infections and ear infections are examples of flu-related complications. If you have a medical condition, such as heart disease, cancer or diabetes, flu can make it worse. Flu can  cause fever and chills, sore throat, muscle aches, fatigue, cough, headache, and runny or stuffy nose. Some people may have vomiting and diarrhea, though this is more common in children than adults. Each year thousands of people in the [place] die from flu, and many more are hospitalized. Flu vaccine prevents millions of illnesses and flu-related visits to the doctor each year. 2. Influenza vaccine CDC recommends everyone 34 months of age and older get vaccinated every flu season. Children 6 months through 52 years of age may need 2 doses during a single flu  season. Everyone else needs only 1 dose each flu season. It takes about 2 weeks for protection to develop after vaccination. There are many flu viruses, and they are always changing. Each year a new flu vaccine is made to protect against three or four viruses that are likely to cause disease in the upcoming flu season. Even when the vaccine doesn't exactly match these viruses, it may still provide some protection. Influenza vaccine does not cause flu. Influenza vaccine may be given at the same time as other vaccines. 3. Talk with your health care provider Tell your vaccine provider if the person getting the vaccine:  Has had an allergic reaction after a previous dose of influenza vaccine, or has any severe, life-threatening allergies.  Has ever had Guillain-Barr Syndrome (also called GBS). In some cases, your health care provider may decide to postpone influenza vaccination to a future visit. People with minor illnesses, such as a cold, may be vaccinated. People who are moderately or severely ill should usually wait until they recover before getting influenza vaccine. Your health care provider can give you more information. 4. Risks of a vaccine reaction  Soreness, redness, and swelling where shot is given, fever, muscle aches, and headache can happen after influenza vaccine.  There may be a very small increased risk of Guillain-Barr Syndrome (GBS) after inactivated influenza vaccine (the flu shot). Young children who get the flu shot along with pneumococcal vaccine (PCV13), and/or DTaP vaccine at the same time might be slightly more likely to have a seizure caused by fever. Tell your health care provider if a child who is getting flu vaccine has ever had a seizure. People sometimes faint after medical procedures, including vaccination. Tell your provider if you feel dizzy or have vision changes or ringing in the ears. As with any medicine, there is a very remote chance of a vaccine causing a  severe allergic reaction, other serious injury, or death. 5. What if there is a serious problem? An allergic reaction could occur after the vaccinated person leaves the clinic. If you see signs of a severe allergic reaction (hives, swelling of the face and throat, difficulty breathing, a fast heartbeat, dizziness, or weakness), call 9-1-1 and get the person to the nearest hospital. For other signs that concern you, call your health care provider. Adverse reactions should be reported to the Vaccine Adverse Event Reporting System (VAERS). Your health care provider will usually file this report, or you can do it yourself. Visit the VAERS website at www.vaers.SamedayNews.es or call 303-037-2204.VAERS is only for reporting reactions, and VAERS staff do not give medical advice. 6. The National Vaccine Injury Compensation Program The [employer] Vaccine Injury Compensation Program (VICP) is a federal program that was created to compensate people who may have been injured by certain vaccines. Visit the VICP website at GoldCloset.com.ee or call 239-364-8856 to learn about the program and about filing a claim. There is a time limit to  file a claim for compensation. 7. How can I learn more?  Ask your healthcare provider.  Call your local or state health department.  Contact the Centers for Disease Control and Prevention (CDC): ? Call 9015860830 (1-800-CDC-INFO) or ? Visit CDC's https://gibson.com/ Vaccine Information Statement (Interim) Inactivated Influenza Vaccine (10/25/2017) This information is not intended to replace advice given to you by your health care provider. Make sure you discuss any questions you have with your health care provider. Document Released: 12/22/2005 Document Revised: 06/18/2018 Document Reviewed: 10/29/2017 Elsevier Patient Education  2020 [employer] self-awareness is knowing how your breasts look and feel. Doing breast self-awareness  is important. It allows you to catch a breast problem early while it is still small and can be treated. All women should do breast self-awareness, including women who have had breast implants. Tell your doctor if you notice a change in your breasts. What you need:  A mirror.  A well-lit room. How to do a breast self-exam A breast self-exam is one way to learn what is normal for your breasts and to check for changes. To do a breast self-exam: Look for changes  1. Take off all the clothes above your waist. 2. Stand in front of a mirror in a room with good lighting. 3. Put your hands on your hips. 4. Push your hands down. 5. Look at your breasts and nipples in the mirror to see if one breast or nipple looks different from the other. Check to see if: ? The shape of one breast is different. ? The size of one breast is different. ? There are wrinkles, dips, and bumps in one breast and not the other. 6. Look at each breast for changes in the skin, such as: ? Redness. ? Scaly areas. 7. Look for changes in your nipples, such as: ? Liquid around the nipples. ? Bleeding. ? Dimpling. ? Redness. ? A change in where the nipples are. Feel for changes  1. Lie on your back on the floor. 2. Feel each breast. To do this, follow these steps: ? Pick a breast to feel. ? Put the arm closest to that breast above your head. ? Use your other arm to feel the nipple area of your breast. Feel the area with the pads of your three middle fingers by making small circles with your fingers. For the first circle, press lightly. For the second circle, press harder. For the third circle, press even harder. ? Keep making circles with your fingers at the different pressures as you move down your breast. Stop when you feel your ribs. ? Move your fingers a little toward the center of your body. ? Start making circles with your fingers again, this time going up until you reach your collarbone. ? Keep making up-and-down  circles until you reach your armpit. Remember to keep using the three pressures. ? Feel the other breast in the same way. 3. Sit or stand in the tub or shower. 4. With soapy water on your skin, feel each breast the same way you did in step 2 when you were lying on the floor. Write down what you find Writing down what you find can help you remember what to tell your doctor. Write down:  What is normal for each breast.  Any changes you find in each breast, including: ? The kind of changes you find. ? Whether you have pain. ? Size and location of any lumps.  When you last had  your menstrual period. General tips  Check your breasts every month.  If you are breastfeeding, the best time to check your breasts is after you feed your baby or after you use a breast pump.  If you get menstrual periods, the best time to check your breasts is 5-7 days after your menstrual period is over.  With time, you will become comfortable with the self-exam, and you will begin to know if there are changes in your breasts. Contact a doctor if you:  See a change in the shape or size of your breasts or nipples.  See a change in the skin of your breast or nipples, such as red or scaly skin.  Have fluid coming from your nipples that is not normal.  Find a lump or thick area that was not there before.  Have pain in your breasts.  Have any concerns about your breast health. Summary  Breast self-awareness includes looking for changes in your breasts, as well as feeling for changes within your breasts.  Breast self-awareness should be done in front of a mirror in a well-lit room.  You should check your breasts every month. If you get menstrual periods, the best time to check your breasts is 5-7 days after your menstrual period is over.  Let your doctor know of any changes you see in your breasts, including changes in size, changes on the skin, pain or tenderness, or fluid from your nipples that is not  normal. This information is not intended to replace advice given to you by your health care provider. Make sure you discuss any questions you have with your health care provider. Document Released: 08/16/2007 Document Revised: 10/16/2017 Document Reviewed: 10/16/2017 Elsevier Patient Education  2020 [employer].

## 2018-12-03 ENCOUNTER — Encounter: Payer: Self-pay | Admitting: Obstetrics and Gynecology

## 2018-12-03 ENCOUNTER — Other Ambulatory Visit: Payer: Self-pay

## 2018-12-03 ENCOUNTER — Other Ambulatory Visit (HOSPITAL_COMMUNITY)
Admission: RE | Admit: 2018-12-03 | Discharge: 2018-12-03 | Disposition: A | Discharge status: Home / Self Care | Payer: BC Managed Care – PPO | Source: Ambulatory Visit | Attending: Obstetrics and Gynecology | Admitting: Obstetrics and Gynecology

## 2018-12-03 ENCOUNTER — Other Ambulatory Visit: Payer: Self-pay | Admitting: Obstetrics and Gynecology

## 2018-12-03 ENCOUNTER — Ambulatory Visit (INDEPENDENT_AMBULATORY_CARE_PROVIDER_SITE_OTHER): Payer: BC Managed Care – PPO | Admitting: Obstetrics and Gynecology

## 2018-12-03 VITALS — BP 154/87 | HR 90 | Ht 62.0 in | Wt 124.1 lb

## 2018-12-03 DIAGNOSIS — Z01419 Encounter for gynecological examination (general) (routine) without abnormal findings: Secondary | ICD-10-CM | POA: Diagnosis not present

## 2018-12-03 DIAGNOSIS — Z1239 Encounter for other screening for malignant neoplasm of breast: Secondary | ICD-10-CM | POA: Diagnosis not present

## 2018-12-03 DIAGNOSIS — M8580 Other specified disorders of bone density and structure, unspecified site: Secondary | ICD-10-CM

## 2018-12-03 DIAGNOSIS — Z23 Encounter for immunization: Secondary | ICD-10-CM

## 2018-12-03 DIAGNOSIS — N952 Postmenopausal atrophic vaginitis: Secondary | ICD-10-CM

## 2018-12-03 NOTE — Progress Notes (Signed)
ANNUAL PREVENTATIVE CARE GYNECOLOGY  ENCOUNTER NOTE  Subjective:       Jessica Frey is a 63 y.o. [id number] female here for a routine annual gynecologic exam. The patient is sexually active. The patient has never been taking hormone replacement therapy. Patient denies post-menopausal vaginal bleeding. The patient wears seatbelts: yes. The patient participates in regular exercise: yes. Has the patient ever been transfused or tattooed?: no. The patient reports that there is not domestic violence in her life.   Current complaints: 1.  None    Gynecologic History No LMP recorded. Patient is postmenopausal. Contraception: post menopausal status Last Pap: 11/16/2015. Results were: normal Last mammogram: 12/17/2017. Results were: normal Last Colonoscopy: 09/21/2016. Results were: benign colon polyp, diverticula. Repeat in 5 years.  Last Dexa Scan: Never had one.    Obstetric History OB History  Gravida Para Term Preterm AB Living  2 2 2     2   SAB TAB Ectopic Multiple Live Births          2    # Outcome Date GA Lbr Len/2nd Weight Sex Delivery Anes PTL Lv  2 Term 44    F CS-LTranv   LIV  1 Term 1989    M CS-LTranv   LIV    Past Medical History:  Diagnosis Date  . Hot flashes   . Menopause   . Osteopenia   . Vaginal atrophy     Family History  Problem Relation Age of Onset  . Heart disease Father   . Cancer Neg Hx   . Breast cancer Neg Hx   . Colon cancer Neg Hx   . Esophageal cancer Neg Hx   . Rectal cancer Neg Hx   . Stomach cancer Neg Hx   . Pancreatic cancer Neg Hx     Past Surgical History:  Procedure Laterality Date  . CESAREAN SECTION     [id number]  . COLONOSCOPY    . HERNIA REPAIR  1959  . WISDOM TOOTH EXTRACTION      Social History   Socioeconomic History  . Marital status: Married    Spouse name: Not on file  . Number of children: Not on file  . Years of education: Not on file  . Highest education level: Not on file  Occupational History  . Not on  file  Social Needs  . Financial resource strain: Not on file  . Food insecurity    Worry: Not on file    Inability: Not on file  . Transportation needs    Medical: Not on file    Non-medical: Not on file  Tobacco Use  . Smoking status: Never Smoker  . Smokeless tobacco: Never Used  Substance and Sexual Activity  . Alcohol use: No  . Drug use: No  . Sexual activity: Yes    Birth control/protection: Post-menopausal  Lifestyle  . Physical activity    Days per week: 6 days    Minutes per session: 30 min  . Stress: Not on file  Relationships  . Social Herbalist on phone: Not on file    Gets together: Not on file    Attends religious service: Not on file    Active member of club or organization: Not on file    Attends meetings of clubs or organizations: Not on file    Relationship status: Not on file  . Intimate partner violence    Fear of current or ex partner: Not on file  Emotionally abused: Not on file    Physically abused: Not on file    Forced sexual activity: Not on file  Other Topics Concern  . Not on file  Social History Narrative  . Not on file    Current Outpatient Medications on File Prior to Visit  Medication Sig Dispense Refill  . calcium-vitamin D 250-100 MG-UNIT per tablet Take 1 tablet by mouth 2 (two) times daily.    . coconut oil OIL Apply 1 application topically as needed.    . Collagen 500 MG CAPS Take by mouth.    Marland Kitchen geriatric multivitamins-minerals (ELDERTONIC/GEVRABON) ELIX Take 15 mLs by mouth daily.    Marland Kitchen OVER THE COUNTER MEDICATION Nueve vaginal lubricant. 1 application 1 time a week.     No current facility-administered medications on file prior to visit.     No Known Allergies    Review of Systems ROS Review of Systems - General ROS: negative for - chills, fatigue, fever, hot flashes, night sweats, weight gain or weight loss Psychological ROS: negative for - anxiety, decreased libido, depression, mood swings, physical abuse  or sexual abuse Ophthalmic ROS: negative for - blurry vision, eye pain or loss of vision ENT ROS: negative for - headaches, hearing change, visual changes or vocal changes Allergy and Immunology ROS: negative for - hives, itchy/watery eyes or seasonal allergies Hematological and Lymphatic ROS: negative for - bleeding problems, bruising, swollen lymph nodes or weight loss Endocrine ROS: negative for - galactorrhea, hair pattern changes, hot flashes, malaise/lethargy, mood swings, palpitations, polydipsia/polyuria, skin changes, temperature intolerance or unexpected weight changes Breast ROS: negative for - new or changing breast lumps or nipple discharge Respiratory ROS: negative for - cough or shortness of breath Cardiovascular ROS: negative for - chest pain, irregular heartbeat, palpitations or shortness of breath Gastrointestinal ROS: no abdominal pain, change in bowel habits, or black or bloody stools Genito-Urinary ROS: no dysuria, trouble voiding, or hematuria Musculoskeletal ROS: negative for - joint pain or joint stiffness Neurological ROS: negative for - bowel and bladder control changes Dermatological ROS: negative for rash and skin lesion changes   Objective:   BP (!) 154/87   Pulse 90   Ht 5\' 2"  (1.575 m)   Wt 124 lb 1.6 oz (56.3 kg)   BMI 22.70 kg/m  CONSTITUTIONAL: Well-developed, well-nourished female in no acute distress.  PSYCHIATRIC: Normal mood and affect. Normal behavior. Normal judgment and thought content. [place]: Alert and oriented to person, place, and time. Normal muscle tone coordination. No cranial nerve deficit noted. HENT:  Normocephalic, atraumatic, External right and left ear normal. Oropharynx is clear and moist EYES: Conjunctivae and EOM are normal. Pupils are equal, round, and reactive to light. No scleral icterus.  NECK: Normal range of motion, supple, no masses.  Normal thyroid.  SKIN: Skin is warm and dry. No rash noted. Not diaphoretic. No  erythema. No pallor. CARDIOVASCULAR: Normal heart rate noted, regular rhythm, no murmur. RESPIRATORY: Clear to auscultation bilaterally. Effort and breath sounds normal, no problems with respiration noted. BREASTS: Symmetric in size. No masses, skin changes, nipple drainage, or lymphadenopathy. ABDOMEN: Soft, normal bowel sounds, no distention noted.  No tenderness, rebound or guarding.  BLADDER: Normal PELVIC:  Bladder no bladder distension noted  Urethra: normal appearing urethra with no masses, tenderness or lesions  Vulva: normal appearing vulva with no masses, tenderness or lesions  Vagina: moderate to severe atrophy  Cervix: normal appearing cervix without discharge or lesions (however difficult to assess due to atrophy and discomfort  with exam).   Uterus: uterus is normal size, shape, consistency and nontender  Adnexa: normal adnexa in size, nontender and no masses  RV: External Exam NormaI, No Rectal Masses and Normal Sphincter tone  MUSCULOSKELETAL: Normal range of motion. No tenderness.  No cyanosis, clubbing, or edema.  2+ distal pulses. LYMPHATIC: No Axillary, Supraclavicular, or Inguinal Adenopathy.   Labs: Has labs done through her employer.   Assessment:   Well woman exam with routine gynecological exam  Screening for breast cancer Flu vaccine need Vaginal atrophy Osteopenia  Plan:  Pap: Pap Co Test Mammogram: Ordered for next year.  Up to date.  Stool Guaiac Testing:  Not Indicated. Colonoscopy up to date.  Labs: Performed through employer.  Routine preventative health maintenance measures emphasized: Exercise/Diet/Weight control, Tobacco Warnings, Alcohol/Substance use risks, Stress Management, Peer Pressure Issues and Safe Sex.  Vaginal atrophy managed with coconut oil.  Continue with calcium, Vitamin D, and regular exercise.  Flu vaccine given.  Return to [place], MD  [facility]

## 2018-12-05 LAB — CYTOLOGY - PAP
Diagnosis: NEGATIVE
High risk HPV: NEGATIVE
Molecular Disclaimer: 56
Molecular Disclaimer: DETECTED
Molecular Disclaimer: NORMAL

## 2018-12-05 LAB — CERVICOVAGINAL ANCILLARY ONLY: HPV: NOT DETECTED

## 2019-01-13 ENCOUNTER — Ambulatory Visit
Admission: RE | Admit: 2019-01-13 | Discharge: 2019-01-13 | Disposition: A | Discharge status: Home / Self Care | Payer: BC Managed Care – PPO | Source: Ambulatory Visit | Attending: Obstetrics and Gynecology | Admitting: Obstetrics and Gynecology

## 2019-01-13 DIAGNOSIS — Z1231 Encounter for screening mammogram for malignant neoplasm of breast: Secondary | ICD-10-CM | POA: Insufficient documentation

## 2019-01-13 DIAGNOSIS — Z1239 Encounter for other screening for malignant neoplasm of breast: Secondary | ICD-10-CM

## 2019-12-04 ENCOUNTER — Encounter: Payer: BC Managed Care – PPO | Admitting: Obstetrics and Gynecology

## 2019-12-10 ENCOUNTER — Telehealth: Payer: Self-pay

## 2019-12-10 ENCOUNTER — Other Ambulatory Visit: Payer: Self-pay

## 2019-12-10 ENCOUNTER — Ambulatory Visit (INDEPENDENT_AMBULATORY_CARE_PROVIDER_SITE_OTHER): Payer: BC Managed Care – PPO | Admitting: Obstetrics and Gynecology

## 2019-12-10 DIAGNOSIS — Z23 Encounter for immunization: Secondary | ICD-10-CM | POA: Diagnosis not present

## 2019-12-10 NOTE — Progress Notes (Signed)
Pt present for flu vaccine Pt tolerated well. No adverse reactions.

## 2019-12-10 NOTE — Telephone Encounter (Signed)
Pt is schedule for 12/15/19 Called pt to see if she could stop by today for her flu vaccine per Joyice Faster, CMA. Pt agreed to come in today around 1 pm.  Pt stated her daughter was also on the schedule and that she will also come at 1 pm. Informed pt to let me know if anything changes. Pt verbalized understanding.

## 2019-12-13 ENCOUNTER — Encounter: Payer: Self-pay | Admitting: Obstetrics and Gynecology

## 2019-12-13 NOTE — Patient Instructions (Signed)
Influenza Virus Vaccine injection What is this medicine? INFLUENZA VIRUS VACCINE (in floo EN zuh VAHY ruhs vak SEEN) helps to reduce the risk of getting influenza also known as the flu. The vaccine only helps protect you against some strains of the flu. This medicine may be used for other purposes; ask your health care provider or pharmacist if you have questions. COMMON BRAND NAME(S): Afluria, Afluria Quadrivalent, Agriflu, Alfuria, FLUAD, Fluarix, Fluarix Quadrivalent, Flublok, Flublok Quadrivalent, FLUCELVAX, FLUCELVAX Quadrivalent, Flulaval, Flulaval Quadrivalent, Fluvirin, Fluzone, Fluzone High-Dose, Fluzone Intradermal, Fluzone Quadrivalent What should I tell my health care provider before I take this medicine? They need to know if you have any of these conditions:  bleeding disorder like hemophilia  fever or infection  Guillain-Barre syndrome or other neurological problems  immune system problems  infection with the human immunodeficiency virus (HIV) or AIDS  low blood platelet counts  multiple sclerosis  an unusual or allergic reaction to influenza virus vaccine, latex, other medicines, foods, dyes, or preservatives. Different brands of vaccines contain different allergens. Some may contain latex or eggs. Talk to your doctor about your allergies to make sure that you get the right vaccine.  pregnant or trying to get pregnant  breast-feeding How should I use this medicine? This vaccine is for injection into a muscle or under the skin. It is given by a health care professional. A copy of Vaccine Information Statements will be given before each vaccination. Read this sheet carefully each time. The sheet may change frequently. Talk to your healthcare provider to see which vaccines are right for you. Some vaccines should not be used in all age groups. Overdosage: If you think you have taken too much of this medicine contact a poison control center or emergency room at once. NOTE:  This medicine is only for you. Do not share this medicine with others. What if I miss a dose? This does not apply. What may interact with this medicine?  chemotherapy or radiation therapy  medicines that lower your immune system like etanercept, anakinra, infliximab, and adalimumab  medicines that treat or prevent blood clots like warfarin  phenytoin  steroid medicines like prednisone or cortisone  theophylline  vaccines This list may not describe all possible interactions. Give your health care provider a list of all the medicines, herbs, non-prescription drugs, or dietary supplements you use. Also tell them if you smoke, drink alcohol, or use illegal drugs. Some items may interact with your medicine. What should I watch for while using this medicine? Report any side effects that do not go away within 3 days to your doctor or health care professional. Call your health care provider if any unusual symptoms occur within 6 weeks of receiving this vaccine. You may still catch the flu, but the illness is not usually as bad. You cannot get the flu from the vaccine. The vaccine will not protect against colds or other illnesses that may cause fever. The vaccine is needed every year. What side effects may I notice from receiving this medicine? Side effects that you should report to your doctor or health care professional as soon as possible:  allergic reactions like skin rash, itching or hives, swelling of the face, lips, or tongue Side effects that usually do not require medical attention (report to your doctor or health care professional if they continue or are bothersome):  fever  headache  muscle aches and pains  pain, tenderness, redness, or swelling at the injection site  tiredness This list may not describe  all possible side effects. Call your doctor for medical advice about side effects. You may report side effects to FDA at 1-800-FDA-1088. Where should I keep my medicine? The  vaccine will be given by a health care professional in a clinic, pharmacy, doctor's office, or other health care setting. You will not be given vaccine doses to store at home. NOTE: This sheet is a summary. It may not cover all possible information. If you have questions about this medicine, talk to your doctor, pharmacist, or health care provider.  2020 Elsevier/Gold Standard (2018-01-22 08:45:43)

## 2019-12-15 ENCOUNTER — Ambulatory Visit: Payer: BC Managed Care – PPO

## 2020-01-14 DIAGNOSIS — Z1152 Encounter for screening for COVID-19: Secondary | ICD-10-CM | POA: Diagnosis not present

## 2020-01-14 DIAGNOSIS — Z03818 Encounter for observation for suspected exposure to other biological agents ruled out: Secondary | ICD-10-CM | POA: Diagnosis not present

## 2020-02-04 ENCOUNTER — Encounter: Payer: BC Managed Care – PPO | Admitting: Obstetrics and Gynecology

## 2020-02-26 NOTE — Progress Notes (Signed)
ANNUAL PREVENTATIVE CARE GYNECOLOGY  ENCOUNTER NOTE  Subjective:       Jessica Frey is a 64 y.o. G50P2002 female here for a routine annual gynecologic exam. The patient is sexually active. The patient has never been taking hormone replacement therapy. Patient denies post-menopausal vaginal bleeding. The patient wears seatbelts: yes. The patient participates in regular exercise: yes. Has the patient ever been transfused or tattooed?: no.   Current complaints: 1.  None    Gynecologic History No LMP recorded. Patient is postmenopausal. Contraception: post menopausal status Last Pap: 11/2018. Results were: normal Last mammogram: 12/17/2017. Results were: normal Last Colonoscopy: 09/21/2016. Results were: benign colon polyp, diverticula. Repeat in 5 years.  Last Dexa Scan: Never had one.    Obstetric History OB History  Gravida Para Term Preterm AB Living  2 2 2     2   SAB IAB Ectopic Multiple Live Births          2    # Outcome Date GA Lbr Len/2nd Weight Sex Delivery Anes PTL Lv  2 Term 53    F CS-LTranv   LIV  1 Term 1989    M CS-LTranv   LIV    Past Medical History:  Diagnosis Date  . Hot flashes   . Menopause   . Osteopenia   . Vaginal atrophy     Family History  Problem Relation Age of Onset  . Heart disease Father   . Cancer Neg Hx   . Breast cancer Neg Hx   . Colon cancer Neg Hx   . Esophageal cancer Neg Hx   . Rectal cancer Neg Hx   . Stomach cancer Neg Hx   . Pancreatic cancer Neg Hx     Past Surgical History:  Procedure Laterality Date  . CESAREAN SECTION     [id number]  . COLONOSCOPY    . HERNIA REPAIR  1959  . WISDOM TOOTH EXTRACTION      Social History   Socioeconomic History  . Marital status: Married    Spouse name: Not on file  . Number of children: Not on file  . Years of education: Not on file  . Highest education level: Not on file  Occupational History  . Not on file  Tobacco Use  . Smoking status: Never Smoker  . Smokeless  tobacco: Never Used  Vaping Use  . Vaping Use: Never used  Substance and Sexual Activity  . Alcohol use: No  . Drug use: No  . Sexual activity: Yes    Birth control/protection: Post-menopausal  Other Topics Concern  . Not on file  Social History Narrative  . Not on file   Social Determinants of Health   Financial Resource Strain: Not on file  Food Insecurity: Not on file  Transportation Needs: Not on file  Physical Activity: Not on file  Stress: Not on file  Social Connections: Not on file  Intimate Partner Violence: Not on file    Current Outpatient Medications on File Prior to Visit  Medication Sig Dispense Refill  . calcium-vitamin D 250-100 MG-UNIT per tablet Take 1 tablet by mouth 2 (two) times daily.    . coconut oil OIL Apply 1 application topically as needed.    . Collagen 500 MG CAPS Take by mouth.    Marland Kitchen geriatric multivitamins-minerals (ELDERTONIC/GEVRABON) ELIX Take 15 mLs by mouth daily.    Marland Kitchen OVER THE COUNTER MEDICATION Nueve vaginal lubricant. 1 application 1 time a week.     No current  facility-administered medications on file prior to visit.    No Known Allergies    Review of Systems ROS Review of Systems - General ROS: negative for - chills, fatigue, fever, hot flashes, night sweats, weight gain or weight loss Psychological ROS: negative for - anxiety, decreased libido, depression, mood swings, physical abuse or sexual abuse Ophthalmic ROS: negative for - blurry vision, eye pain or loss of vision ENT ROS: negative for - headaches, hearing change, visual changes or vocal changes Allergy and Immunology ROS: negative for - hives, itchy/watery eyes or seasonal allergies Hematological and Lymphatic ROS: negative for - bleeding problems, bruising, swollen lymph nodes or weight loss Endocrine ROS: negative for - galactorrhea, hair pattern changes, hot flashes, malaise/lethargy, mood swings, palpitations, polydipsia/polyuria, skin changes, temperature intolerance  or unexpected weight changes Breast ROS: negative for - new or changing breast lumps or nipple discharge Respiratory ROS: negative for - cough or shortness of breath Cardiovascular ROS: negative for - chest pain, irregular heartbeat, palpitations or shortness of breath Gastrointestinal ROS: no abdominal pain, change in bowel habits, or black or bloody stools Genito-Urinary ROS: no dysuria, trouble voiding, or hematuria Musculoskeletal ROS: negative for - joint pain or joint stiffness Neurological ROS: negative for - bowel and bladder control changes Dermatological ROS: negative for rash and skin lesion changes   Objective:   BP (!) 137/92   Pulse 91   Ht 5\' 2"  (1.575 m)   Wt 132 lb 4.8 oz (60 kg)   BMI 24.20 kg/m  CONSTITUTIONAL: Well-developed, well-nourished female in no acute distress.  PSYCHIATRIC: Normal mood and affect. Normal behavior. Normal judgment and thought content. [place]: Alert and oriented to person, place, and time. Normal muscle tone coordination. No cranial nerve deficit noted. HENT:  Normocephalic, atraumatic, External right and left ear normal. Oropharynx is clear and moist EYES: Conjunctivae and EOM are normal. Pupils are equal, round, and reactive to light. No scleral icterus.  NECK: Normal range of motion, supple, no masses.  Normal thyroid.  SKIN: Skin is warm and dry. No rash noted. Not diaphoretic. No erythema. No pallor. CARDIOVASCULAR: Normal heart rate noted, regular rhythm, no murmur. RESPIRATORY: Clear to auscultation bilaterally. Effort and breath sounds normal, no problems with respiration noted. BREASTS: Symmetric in size. Fibrocystic changes bilaterally. Skin changes, nipple drainage, or lymphadenopathy.  ABDOMEN: Soft, normal bowel sounds, no distention noted.  No tenderness, rebound or guarding.  BLADDER: Normal PELVIC:  Bladder no bladder distension noted  Urethra: normal appearing urethra with no masses, tenderness or lesions  Vulva: normal  appearing vulva with no masses, tenderness or lesions  Vagina: moderate to severe atrophy  Cervix: normal appearing cervix without discharge or lesions (however difficult to assess due to atrophy and discomfort with exam).   Uterus: uterus is normal size, shape, consistency and nontender  Adnexa: normal adnexa in size, nontender and no masses  RV: External Exam NormaI, No Rectal Masses and Normal Sphincter tone  MUSCULOSKELETAL: Normal range of motion. No tenderness.  No cyanosis, clubbing, or edema.  2+ distal pulses. LYMPHATIC: No Axillary, Supraclavicular, or Inguinal Adenopathy.   Labs: Has labs done through her employer.   Assessment:   Well woman exam with routine gynecological exam   Flu vaccine need Vaginal atrophy Osteopenia  Plan:  Pap: Pap Co Test Mammogram: Up to date.  Stool Guaiac Testing:  Not Indicated. Colonoscopy up to date. Due in 1 year.  Labs: Performed through employer.  Routine preventative health maintenance measures emphasized: Exercise/Diet/Weight control, Tobacco Warnings, Alcohol/Substance use  risks, Stress Management, Peer Pressure Issues and Safe Sex.  Vaginal atrophy managed with coconut oil.  Continue with calcium, Vitamin D, and regular exercise.  Flu vaccine up to date.  COVID vaccine: patient has completed [employer] series and received Booster.  Patient has never been screened for HIV or HCV, per her remembrance. Will order today as it is recommended for 1 screen per lifetime.  Return to [place], MD  [facility]

## 2020-02-26 NOTE — Progress Notes (Signed)
Pt present for annual exam. Pt had Moderna covid vaccines 05/28/2019;06/24/2019; booster 02/10/2020. Pt stated that she was doing well.

## 2020-02-27 ENCOUNTER — Encounter: Payer: Self-pay | Admitting: Obstetrics and Gynecology

## 2020-02-27 ENCOUNTER — Ambulatory Visit (INDEPENDENT_AMBULATORY_CARE_PROVIDER_SITE_OTHER): Payer: BC Managed Care – PPO | Admitting: Obstetrics and Gynecology

## 2020-02-27 ENCOUNTER — Other Ambulatory Visit: Payer: Self-pay | Admitting: Obstetrics and Gynecology

## 2020-02-27 ENCOUNTER — Other Ambulatory Visit: Payer: Self-pay

## 2020-02-27 VITALS — BP 137/92 | HR 91 | Ht 62.0 in | Wt 132.3 lb

## 2020-02-27 DIAGNOSIS — M8580 Other specified disorders of bone density and structure, unspecified site: Secondary | ICD-10-CM

## 2020-02-27 DIAGNOSIS — N952 Postmenopausal atrophic vaginitis: Secondary | ICD-10-CM | POA: Diagnosis not present

## 2020-02-27 DIAGNOSIS — Z1231 Encounter for screening mammogram for malignant neoplasm of breast: Secondary | ICD-10-CM

## 2020-02-27 DIAGNOSIS — Z113 Encounter for screening for infections with a predominantly sexual mode of transmission: Secondary | ICD-10-CM

## 2020-02-27 DIAGNOSIS — N6012 Diffuse cystic mastopathy of left breast: Secondary | ICD-10-CM

## 2020-02-27 DIAGNOSIS — N6011 Diffuse cystic mastopathy of right breast: Secondary | ICD-10-CM

## 2020-02-27 DIAGNOSIS — Z01419 Encounter for gynecological examination (general) (routine) without abnormal findings: Secondary | ICD-10-CM

## 2020-02-27 NOTE — Patient Instructions (Signed)
Preventive Care 40-64 Years Old, Female Preventive care refers to visits with your health care provider and lifestyle choices that can promote health and wellness. This includes:  A yearly physical exam. This may also be called an annual well check.  Regular dental visits and eye exams.  Immunizations.  Screening for certain conditions.  Healthy lifestyle choices, such as eating a healthy diet, getting regular exercise, not using drugs or products that contain nicotine and tobacco, and limiting alcohol use. What can I expect for my preventive care visit? Physical exam Your health care provider will check your:  Height and weight. This may be used to calculate body mass index (BMI), which tells if you are at a healthy weight.  Heart rate and blood pressure.  Skin for abnormal spots. Counseling Your health care provider may ask you questions about your:  Alcohol, tobacco, and drug use.  Emotional well-being.  Home and relationship well-being.  Sexual activity.  Eating habits.  Work and work environment.  Method of birth control.  Menstrual cycle.  Pregnancy history. What immunizations do I need?  Influenza (flu) vaccine  This is recommended every year. Tetanus, diphtheria, and pertussis (Tdap) vaccine  You may need a Td booster every 10 years. Varicella (chickenpox) vaccine  You may need this if you have not been vaccinated. Zoster (shingles) vaccine  You may need this after age 60. Measles, mumps, and rubella (MMR) vaccine  You may need at least one dose of MMR if you were born in 1957 or later. You may also need a second dose. Pneumococcal conjugate (PCV13) vaccine  You may need this if you have certain conditions and were not previously vaccinated. Pneumococcal polysaccharide (PPSV23) vaccine  You may need one or two doses if you smoke cigarettes or if you have certain conditions. Meningococcal conjugate (MenACWY) vaccine  You may need this if you  have certain conditions. Hepatitis A vaccine  You may need this if you have certain conditions or if you travel or work in places where you may be exposed to hepatitis A. Hepatitis B vaccine  You may need this if you have certain conditions or if you travel or work in places where you may be exposed to hepatitis B. Haemophilus influenzae type b (Hib) vaccine  You may need this if you have certain conditions. Human papillomavirus (HPV) vaccine  If recommended by your health care provider, you may need three doses over 6 months. You may receive vaccines as individual doses or as more than one vaccine together in one shot (combination vaccines). Talk with your health care provider about the risks and benefits of combination vaccines. What tests do I need? Blood tests  Lipid and cholesterol levels. These may be checked every 5 years, or more frequently if you are over 50 years old.  Hepatitis C test.  Hepatitis B test. Screening  Lung cancer screening. You may have this screening every year starting at age 55 if you have a 30-pack-year history of smoking and currently smoke or have quit within the past 15 years.  Colorectal cancer screening. All adults should have this screening starting at age 50 and continuing until age 75. Your health care provider may recommend screening at age 45 if you are at increased risk. You will have tests every 1-10 years, depending on your results and the type of screening test.  Diabetes screening. This is done by checking your blood sugar (glucose) after you have not eaten for a while (fasting). You may have this   done every 1-3 years.  Mammogram. This may be done every 1-2 years. Talk with your health care provider about when you should start having regular mammograms. This may depend on whether you have a family history of breast cancer.  BRCA-related cancer screening. This may be done if you have a family history of breast, ovarian, tubal, or peritoneal  cancers.  Pelvic exam and Pap test. This may be done every 3 years starting at age 52. Starting at age 102, this may be done every 5 years if you have a Pap test in combination with an HPV test. Other tests  Sexually transmitted disease (STD) testing.  Bone density scan. This is done to screen for osteoporosis. You may have this scan if you are at high risk for osteoporosis. Follow these instructions at home: Eating and drinking  Eat a diet that includes fresh fruits and vegetables, whole grains, lean protein, and low-fat dairy.  Take vitamin and mineral supplements as recommended by your health care provider.  Do not drink alcohol if: ? Your health care provider tells you not to drink. ? You are pregnant, may be pregnant, or are planning to become pregnant.  If you drink alcohol: ? Limit how much you have to 0-1 drink a day. ? Be aware of how much alcohol is in your drink. In the [place] one drink equals one 12 oz bottle of beer (355 mL), one 5 oz glass of wine (148 mL), or one 1 oz glass of hard liquor (44 mL). Lifestyle  Take daily care of your teeth and gums.  Stay active. Exercise for at least 30 minutes on 5 or more days each week.  Do not use any products that contain nicotine or tobacco, such as cigarettes, e-cigarettes, and chewing tobacco. If you need help quitting, ask your health care provider.  If you are sexually active, practice safe sex. Use a condom or other form of birth control (contraception) in order to prevent pregnancy and STIs (sexually transmitted infections).  If told by your health care provider, take low-dose aspirin daily starting at age 11. What's next?  Visit your health care provider once a year for a well check visit.  Ask your health care provider how often you should have your eyes and teeth checked.  Stay up to date on all vaccines. This information is not intended to replace advice given to you by your health care provider. Make sure you  discuss any questions you have with your health care provider. Document Revised: 11/08/2017 Document Reviewed: 11/08/2017 Elsevier Patient Education  2020 [place].    Fibrocystic Breast Changes  Fibrocystic breast changes are changes that can make your breasts swollen or painful. These changes happen when tiny sacs of fluid (cysts) form in the breast. This is a common condition. It does not mean that you have cancer. It usually happens because of hormone changes during a monthly period. Follow these instructions at home:  Check your breasts after every monthly period. If you do not have monthly periods, check your breasts on the first day of every month. Check for: ? Soreness. ? New swelling or puffiness. ? A change in breast size. ? A change in a lump that was already there.  Take over-the-counter and prescription medicines only as told by your doctor.  Wear a support or sports bra that fits well. Wear this support especially when you are exercising.  Avoid or have less caffeine, fat, and sugar in what you eat and drink as  told by your doctor. Contact a doctor if:  You have fluid coming from your nipple, especially if the fluid has blood in it.  You have new lumps or bumps in your breast.  Your breast gets puffy, red, and painful.  You have changes in how your breast looks.  Your nipple looks flat or it sinks into your breast. Get help right away if:  Your breast turns red, and the redness is spreading. Summary  Fibrocystic breast changes are changes that can make your breasts swollen or painful.  This condition can happen when you have hormone changes during your monthly period.  With this condition, it is important to check your breasts after every monthly period. If you do not have monthly periods, check your breasts on the first day of every month. This information is not intended to replace advice given to you by your health care provider. Make sure you discuss  any questions you have with your health care provider. Document Revised: 06/20/2018 Document Reviewed: 11/11/2015 Elsevier Patient Education  2020 [place] Breast self-awareness means being familiar with how your breasts look and feel. It involves checking your breasts regularly and reporting any changes to your health care provider. Practicing breast self-awareness is important. Sometimes changes may not be harmful (are benign), but sometimes a change in your breasts can be a sign of a serious medical problem. It is important to learn how to do this procedure correctly so that you can catch problems early, when treatment is more likely to be successful. All women should practice breast self-awareness, including women who have had breast implants. What you need:  A mirror.  A well-lit room. How to do a breast self-exam A breast self-exam is one way to learn what is normal for your breasts and whether your breasts are changing. To do a breast self-exam: Look for changes  1. Remove all the clothing above your waist. 2. Stand in front of a mirror in a room with good lighting. 3. Put your hands on your hips. 4. Push your hands firmly downward. 5. Compare your breasts in the mirror. Look for differences between them (asymmetry), such as: ? Differences in shape. ? Differences in size. ? Puckers, dips, and bumps in one breast and not the other. 6. Look at each breast for changes in the skin, such as: ? Redness. ? Scaly areas. 7. Look for changes in your nipples, such as: ? Discharge. ? Bleeding. ? Dimpling. ? Redness. ? A change in position. Feel for changes Carefully feel your breasts for lumps and changes. It is best to do this while lying on your back on the floor, and again while sitting or standing in the tub or shower with soapy water on your skin. Feel each breast in the following way: 1. Place the arm on the side of the breast you are examining above  your head. 2. Feel your breast with the other hand. 3. Start in the nipple area and make -inch (2 cm) overlapping circles to feel your breast. Use the pads of your three middle fingers to do this. Apply light pressure, then medium pressure, then firm pressure. The light pressure will allow you to feel the tissue closest to the skin. The medium pressure will allow you to feel the tissue that is a little deeper. The firm pressure will allow you to feel the tissue close to the ribs. 4. Continue the overlapping circles, moving downward over the breast until you feel your  ribs below your breast. 5. Move one finger-width toward the center of the body. Continue to use the -inch (2 cm) overlapping circles to feel your breast as you move slowly up toward your collarbone. 6. Continue the up-and-down exam using all three pressures until you reach your armpit.  Write down what you find Writing down what you find can help you remember what to discuss with your health care provider. Write down:  What is normal for each breast.  Any changes that you find in each breast, including: ? The kind of changes you find. ? Any pain or tenderness. ? Size and location of any lumps.  Where you are in your menstrual cycle, if you are still menstruating. General tips and recommendations  Examine your breasts every month.  If you are breastfeeding, the best time to examine your breasts is after a feeding or after using a breast pump.  If you menstruate, the best time to examine your breasts is 5-7 days after your period. Breasts are generally lumpier during menstrual periods, and it may be more difficult to notice changes.  With time and practice, you will become more familiar with the variations in your breasts and more comfortable with the exam. Contact a health care provider if you:  See a change in the shape or size of your breasts or nipples.  See a change in the skin of your breast or nipples, such as a  reddened or scaly area.  Have unusual discharge from your nipples.  Find a lump or thick area that was not there before.  Have pain in your breasts.  Have any concerns related to your breast health. Summary  Breast self-awareness includes looking for physical changes in your breasts, as well as feeling for any changes within your breasts.  Breast self-awareness should be performed in front of a mirror in a well-lit room.  You should examine your breasts every month. If you menstruate, the best time to examine your breasts is 5-7 days after your menstrual period.  Let your health care provider know of any changes you notice in your breasts, including changes in size, changes on the skin, pain or tenderness, or unusual fluid from your nipples. This information is not intended to replace advice given to you by your health care provider. Make sure you discuss any questions you have with your health care provider. Document Revised: 10/16/2017 Document Reviewed: 10/16/2017 Elsevier Patient Education  [place].

## 2020-02-28 LAB — HIV ANTIBODY (ROUTINE TESTING W REFLEX): HIV Screen 4th Generation wRfx: NONREACTIVE

## 2020-02-28 LAB — HEPATITIS C ANTIBODY: Hep C Virus Ab: <0.1 s/co ratio (ref 0.0–0.9)

## 2020-04-02 ENCOUNTER — Ambulatory Visit
Admission: RE | Admit: 2020-04-02 | Discharge: 2020-04-02 | Disposition: A | Discharge status: Home / Self Care | Payer: BC Managed Care – PPO | Source: Ambulatory Visit | Attending: Obstetrics and Gynecology | Admitting: Obstetrics and Gynecology

## 2020-04-02 ENCOUNTER — Other Ambulatory Visit: Payer: Self-pay

## 2020-04-02 DIAGNOSIS — Z1231 Encounter for screening mammogram for malignant neoplasm of breast: Secondary | ICD-10-CM | POA: Insufficient documentation

## 2020-06-07 ENCOUNTER — Ambulatory Visit: Payer: BC Managed Care – PPO | Admitting: Dermatology

## 2020-06-07 ENCOUNTER — Other Ambulatory Visit: Payer: Self-pay

## 2020-06-07 DIAGNOSIS — L820 Inflamed seborrheic keratosis: Secondary | ICD-10-CM | POA: Diagnosis not present

## 2020-06-07 DIAGNOSIS — L821 Other seborrheic keratosis: Secondary | ICD-10-CM | POA: Diagnosis not present

## 2020-06-07 DIAGNOSIS — L578 Other skin changes due to chronic exposure to nonionizing radiation: Secondary | ICD-10-CM | POA: Diagnosis not present

## 2020-06-07 NOTE — Progress Notes (Unsigned)
   New Patient Visit  Subjective  Jessica Frey is a 65 y.o. female who presents for the following: Skin Problem (Pt presents with a scaly patch at her left eyebrow area for about 3 weeks, pt would like this area removed today ).  The following portions of the chart were reviewed this encounter and updated as appropriate:   Tobacco  Allergies  Meds  Problems  Med Hx  Surg Hx  Fam Hx     Review of Systems:  No other skin or systemic complaints except as noted in HPI or Assessment and Plan.  Objective  Well appearing patient in no apparent distress; mood and affect are within normal limits.  A focused examination was performed including face. Relevant physical exam findings are noted in the Assessment and Plan.  Objective  L eyebrow (3): Erythematous keratotic or waxy stuck-on papule or plaque.    Assessment & Plan  Inflamed seborrheic keratosis (3) L eyebrow  Destruction of lesion - L eyebrow Complexity: simple   Destruction method: cryotherapy   Informed consent: discussed and consent obtained   Timeout:  patient name, date of birth, surgical site, and procedure verified Lesion destroyed using liquid nitrogen: Yes   Region frozen until ice ball extended beyond lesion: Yes   Outcome: patient tolerated procedure well with no complications   Post-procedure details: wound care instructions given    Seborrheic Keratoses - Stuck-on, waxy, tan-brown papules and/or plaques  - Benign-appearing - Discussed benign etiology and prognosis. - Observe - Call for any changes  Actinic Damage - chronic, secondary to cumulative UV radiation exposure/sun exposure over time - diffuse scaly erythematous macules with underlying dyspigmentation - Recommend daily broad spectrum sunscreen SPF 30+ to sun-exposed areas, reapply every 2 hours as needed.  - Recommend staying in the shade or wearing long sleeves, sun glasses (UVA+UVB protection) and wide brim hats (4-inch brim around the entire  circumference of the hat). - Call for new or changing lesions.  Return if symptoms worsen or fail to improve.  IMarye Round, CMA, am acting as scribe for Sarina Ser, MD .  Documentation: I have reviewed the above documentation for accuracy and completeness, and I agree with the above.  Sarina Ser, MD

## 2020-06-07 NOTE — Patient Instructions (Addendum)
Cryotherapy Aftercare  . Wash gently with soap and water everyday.   . Apply Vaseline and Band-Aid daily until healed.  

## 2020-06-09 ENCOUNTER — Encounter: Payer: Self-pay | Admitting: Dermatology

## 2020-09-01 ENCOUNTER — Ambulatory Visit: Payer: BC Managed Care – PPO | Admitting: Dermatology

## 2020-10-04 DIAGNOSIS — M25571 Pain in right ankle and joints of right foot: Secondary | ICD-10-CM | POA: Diagnosis not present

## 2020-10-19 DIAGNOSIS — M25571 Pain in right ankle and joints of right foot: Secondary | ICD-10-CM | POA: Diagnosis not present

## 2020-10-21 ENCOUNTER — Ambulatory Visit: Payer: BC Managed Care – PPO | Admitting: Dermatology

## 2020-11-03 DIAGNOSIS — M25571 Pain in right ankle and joints of right foot: Secondary | ICD-10-CM | POA: Diagnosis not present

## 2020-11-12 DIAGNOSIS — M84371A Stress fracture, right ankle, initial encounter for fracture: Secondary | ICD-10-CM | POA: Diagnosis not present

## 2021-03-02 ENCOUNTER — Other Ambulatory Visit: Payer: Self-pay

## 2021-03-02 ENCOUNTER — Ambulatory Visit: Payer: BC Managed Care – PPO | Admitting: Dermatology

## 2021-03-02 DIAGNOSIS — L578 Other skin changes due to chronic exposure to nonionizing radiation: Secondary | ICD-10-CM | POA: Diagnosis not present

## 2021-03-02 DIAGNOSIS — L814 Other melanin hyperpigmentation: Secondary | ICD-10-CM

## 2021-03-02 DIAGNOSIS — L820 Inflamed seborrheic keratosis: Secondary | ICD-10-CM | POA: Diagnosis not present

## 2021-03-02 NOTE — Patient Instructions (Signed)

## 2021-03-02 NOTE — Progress Notes (Signed)
° °  Follow-Up Visit   Subjective  Jessica Frey is a 65 y.o. female who presents for the following: Nevus (Back, pts daughter noticed this year). The patient has spots, moles and lesions to be evaluated, some may be new or changing and the patient has concerns that these could be cancer.  The following portions of the chart were reviewed this encounter and updated as appropriate:   Tobacco   Allergies   Meds   Problems   Med Hx   Surg Hx   Fam Hx      Review of Systems:  No other skin or systemic complaints except as noted in HPI or Assessment and Plan.  Objective  Well appearing patient in no apparent distress; mood and affect are within normal limits.  A focused examination was performed including back. Relevant physical exam findings are noted in the Assessment and Plan.  back x 5 (5) Stuck on waxy paps with erythema        Assessment & Plan  Inflamed seborrheic keratosis (5) back x 5  Destruction of lesion - back x 5 Complexity: simple   Destruction method: cryotherapy   Informed consent: discussed and consent obtained   Timeout:  patient name, date of birth, surgical site, and procedure verified Lesion destroyed using liquid nitrogen: Yes   Region frozen until ice ball extended beyond lesion: Yes   Outcome: patient tolerated procedure well with no complications   Post-procedure details: wound care instructions given    Actinic Damage - chronic, secondary to cumulative UV radiation exposure/sun exposure over time - diffuse scaly erythematous macules with underlying dyspigmentation - Recommend daily broad spectrum sunscreen SPF 30+ to sun-exposed areas, reapply every 2 hours as needed.  - Recommend staying in the shade or wearing long sleeves, sun glasses (UVA+UVB protection) and wide brim hats (4-inch brim around the entire circumference of the hat). - Call for new or changing lesions.  Lentigines - Scattered tan macules - Due to sun exposure - Benign-appering,  observe - Recommend daily broad spectrum sunscreen SPF 30+ to sun-exposed areas, reapply every 2 hours as needed. - Call for any changes  Return if symptoms worsen or fail to improve.  I, Othelia Pulling, RMA, am acting as scribe for Sarina Ser, MD . Documentation: I have reviewed the above documentation for accuracy and completeness, and I agree with the above.  Sarina Ser, MD

## 2021-03-13 ENCOUNTER — Encounter: Payer: Self-pay | Admitting: Dermatology

## 2021-03-24 ENCOUNTER — Ambulatory Visit (INDEPENDENT_AMBULATORY_CARE_PROVIDER_SITE_OTHER): Payer: BC Managed Care – PPO | Admitting: Obstetrics and Gynecology

## 2021-03-24 ENCOUNTER — Other Ambulatory Visit (HOSPITAL_COMMUNITY)
Admission: RE | Admit: 2021-03-24 | Discharge: 2021-03-24 | Disposition: A | Discharge status: Home / Self Care | Payer: BC Managed Care – PPO | Source: Ambulatory Visit | Attending: Obstetrics and Gynecology | Admitting: Obstetrics and Gynecology

## 2021-03-24 ENCOUNTER — Encounter: Payer: Self-pay | Admitting: Obstetrics and Gynecology

## 2021-03-24 ENCOUNTER — Other Ambulatory Visit: Payer: Self-pay

## 2021-03-24 VITALS — BP 145/88 | HR 86 | Ht 63.0 in | Wt 130.0 lb

## 2021-03-24 DIAGNOSIS — N6011 Diffuse cystic mastopathy of right breast: Secondary | ICD-10-CM

## 2021-03-24 DIAGNOSIS — Z124 Encounter for screening for malignant neoplasm of cervix: Secondary | ICD-10-CM | POA: Diagnosis not present

## 2021-03-24 DIAGNOSIS — M8580 Other specified disorders of bone density and structure, unspecified site: Secondary | ICD-10-CM | POA: Diagnosis not present

## 2021-03-24 DIAGNOSIS — Z1231 Encounter for screening mammogram for malignant neoplasm of breast: Secondary | ICD-10-CM

## 2021-03-24 DIAGNOSIS — R030 Elevated blood-pressure reading, without diagnosis of hypertension: Secondary | ICD-10-CM | POA: Insufficient documentation

## 2021-03-24 DIAGNOSIS — Z01419 Encounter for gynecological examination (general) (routine) without abnormal findings: Secondary | ICD-10-CM

## 2021-03-24 DIAGNOSIS — N952 Postmenopausal atrophic vaginitis: Secondary | ICD-10-CM | POA: Diagnosis not present

## 2021-03-24 DIAGNOSIS — N6012 Diffuse cystic mastopathy of left breast: Secondary | ICD-10-CM

## 2021-03-24 NOTE — Progress Notes (Signed)
ANNUAL PREVENTATIVE CARE GYNECOLOGY  ENCOUNTER NOTE  Subjective:       Jessica Frey is a 66 y.o. G25P2002 female here for a routine annual gynecologic exam. The patient is sexually active. The patient has never been taking hormone replacement therapy. Patient denies post-menopausal vaginal bleeding. The patient wears seatbelts: yes. The patient participates in regular exercise: yes. Has the patient ever been transfused or tattooed?: no.   Current complaints: 1.  None    Gynecologic History No LMP recorded. Patient is postmenopausal. Contraception: post menopausal status Last Pap: 11/2018. Results were: normal Last mammogram: 04/02/2020. Results were: normal Last Colonoscopy: 09/21/2016. Results were: benign colon polyp, diverticula. Repeat in 5 years.  Last Dexa Scan: Never had one.    Obstetric History OB History  Gravida Para Term Preterm AB Living  2 2 2     2   SAB IAB Ectopic Multiple Live Births          2    # Outcome Date GA Lbr Len/2nd Weight Sex Delivery Anes PTL Lv  2 Term 1992    F CS-LTranv   LIV  1 Term 33    M CS-LTranv   LIV    Past Medical History:  Diagnosis Date   Hot flashes    Menopause    Osteopenia    Vaginal atrophy     Family History  Problem Relation Age of Onset   Heart disease Father    Cancer Neg Hx    Breast cancer Neg Hx    Colon cancer Neg Hx    Esophageal cancer Neg Hx    Rectal cancer Neg Hx    Stomach cancer Neg Hx    Pancreatic cancer Neg Hx     Past Surgical History:  Procedure Laterality Date   CESAREAN SECTION     2458,0998   COLONOSCOPY     HERNIA REPAIR  1959   WISDOM TOOTH EXTRACTION      Social History   Socioeconomic History   Marital status: Married    Spouse name: Not on file   Number of children: Not on file   Years of education: Not on file   Highest education level: Not on file  Occupational History   Not on file  Tobacco Use   Smoking status: Never   Smokeless tobacco: Never  Vaping Use   Vaping  Use: Never used  Substance and Sexual Activity   Alcohol use: No   Drug use: No   Sexual activity: Yes    Birth control/protection: Post-menopausal  Other Topics Concern   Not on file  Social History Narrative   Not on file   Social Determinants of Health   Financial Resource Strain: Not on file  Food Insecurity: Not on file  Transportation Needs: Not on file  Physical Activity: Not on file  Stress: Not on file  Social Connections: Not on file  Intimate Partner Violence: Not on file    Current Outpatient Medications on File Prior to Visit  Medication Sig Dispense Refill   calcium-vitamin D 250-100 MG-UNIT per tablet Take 1 tablet by mouth 2 (two) times daily.     coconut oil OIL Apply 1 application topically as needed.     Collagen 500 MG CAPS Take by mouth.     geriatric multivitamins-minerals (ELDERTONIC/GEVRABON) ELIX Take 15 mLs by mouth daily.     No current facility-administered medications on file prior to visit.    No Known Allergies    Review of Systems  ROS Review of Systems - General ROS: negative for - chills, fatigue, fever, hot flashes, night sweats, weight gain or weight loss Psychological ROS: negative for - anxiety, decreased libido, depression, mood swings, physical abuse or sexual abuse Ophthalmic ROS: negative for - blurry vision, eye pain or loss of vision ENT ROS: negative for - headaches, hearing change, visual changes or vocal changes Allergy and Immunology ROS: negative for - hives, itchy/watery eyes or seasonal allergies Hematological and Lymphatic ROS: negative for - bleeding problems, bruising, swollen lymph nodes or weight loss Endocrine ROS: negative for - galactorrhea, hair pattern changes, hot flashes, malaise/lethargy, mood swings, palpitations, polydipsia/polyuria, skin changes, temperature intolerance or unexpected weight changes Breast ROS: negative for - new or changing breast lumps or nipple discharge Respiratory ROS: negative for -  cough or shortness of breath Cardiovascular ROS: negative for - chest pain, irregular heartbeat, palpitations or shortness of breath Gastrointestinal ROS: no abdominal pain, change in bowel habits, or black or bloody stools Genito-Urinary ROS: no dysuria, trouble voiding, or hematuria Musculoskeletal ROS: negative for - joint pain or joint stiffness Neurological ROS: negative for - bowel and bladder control changes Dermatological ROS: negative for rash and skin lesion changes   Objective:   BP (!) 165/86    Pulse 86    Ht 5\' 3"  (1.6 m)    Wt 130 lb (59 kg)    SpO2 99%    BMI 23.03 kg/m   Repeat BP 145/88 at end of visit.  CONSTITUTIONAL: Well-developed, well-nourished female in no acute distress.  PSYCHIATRIC: Normal mood and affect. Normal behavior. Normal judgment and thought content. [place]: Alert and oriented to person, place, and time. Normal muscle tone coordination. No cranial nerve deficit noted. HENT:  Normocephalic, atraumatic, External right and left ear normal. Oropharynx is clear and moist EYES: Conjunctivae and EOM are normal. Pupils are equal, round, and reactive to light. No scleral icterus.  NECK: Normal range of motion, supple, no masses.  Normal thyroid.  SKIN: Skin is warm and dry. No rash noted. Not diaphoretic. No erythema. No pallor. CARDIOVASCULAR: Normal heart rate noted, regular rhythm, no murmur. RESPIRATORY: Clear to auscultation bilaterally. Effort and breath sounds normal, no problems with respiration noted. BREASTS: Symmetric in size. Fibrocystic changes bilaterally. Skin changes, nipple drainage, or lymphadenopathy.  ABDOMEN: Soft, normal bowel sounds, no distention noted.  No tenderness, rebound or guarding.  BLADDER: Normal PELVIC:  Bladder no bladder distension noted  Urethra: normal appearing urethra with no masses, tenderness or lesions  Vulva: normal appearing vulva with no masses, tenderness or lesions  Vagina: moderate to severe atrophy, no  lesions  Cervix: normal appearing cervix without discharge or lesions. Dificulty performing pap with pediatric speculum Uterus: uterus is normal size, shape, consistency and nontender  Adnexa: normal adnexa in size, nontender and no masses  RV: External Exam NormaI, No Rectal Masses and Normal Sphincter tone  MUSCULOSKELETAL: Normal range of motion. No tenderness.  No cyanosis, clubbing, or edema.  2+ distal pulses. LYMPHATIC: No Axillary, Supraclavicular, or Inguinal Adenopathy.   Labs: Has labs done through her employer.   Assessment:   Well woman exam with routine gynecological exam  Vaginal atrophy Osteopenia Cervical cancer screening Fibrocystic breasts White coat syndrome  Plan:  Pap: Pap Co Test. No further testing needed after this.  Mammogram: Due this month. Ordered Colon screening:  Colonoscopy due this year.   Labs: Performed through employer.  Routine preventative health maintenance measures emphasized: Exercise/Diet/Weight control, Tobacco Warnings, Alcohol/Substance use risks, Stress Management,  Peer Pressure Issues and Safe Sex.  Vaginal atrophy managed with coconut oil.  Continue with calcium, Vitamin D, and regular exercise. Due for Dexa scan in 1 year. Flu vaccine up to date. Also has had shingles vaccine this year.  COVID vaccine: patient has completed Pfizer series and received booster x 2.  White coat syndrome likely as patient monitors at home and work, with normal readings. Return to Silas, MD  [facility]

## 2021-03-28 LAB — CYTOLOGY - PAP: Diagnosis: NEGATIVE

## 2021-04-28 ENCOUNTER — Other Ambulatory Visit: Payer: Self-pay

## 2021-04-28 ENCOUNTER — Ambulatory Visit
Admission: RE | Admit: 2021-04-28 | Discharge: 2021-04-28 | Disposition: A | Discharge status: Home / Self Care | Payer: BC Managed Care – PPO | Source: Ambulatory Visit | Attending: Obstetrics and Gynecology | Admitting: Obstetrics and Gynecology

## 2021-04-28 ENCOUNTER — Ambulatory Visit: Payer: BC Managed Care – PPO | Admitting: Dermatology

## 2021-04-28 DIAGNOSIS — Z1231 Encounter for screening mammogram for malignant neoplasm of breast: Secondary | ICD-10-CM | POA: Insufficient documentation

## 2021-05-16 ENCOUNTER — Ambulatory Visit: Payer: BC Managed Care – PPO | Admitting: Dermatology

## 2021-05-16 ENCOUNTER — Encounter: Payer: Self-pay | Admitting: Dermatology

## 2021-05-16 ENCOUNTER — Other Ambulatory Visit: Payer: Self-pay

## 2021-05-16 DIAGNOSIS — L720 Epidermal cyst: Secondary | ICD-10-CM

## 2021-05-16 DIAGNOSIS — L578 Other skin changes due to chronic exposure to nonionizing radiation: Secondary | ICD-10-CM

## 2021-05-16 DIAGNOSIS — L738 Other specified follicular disorders: Secondary | ICD-10-CM | POA: Diagnosis not present

## 2021-05-16 DIAGNOSIS — I781 Nevus, non-neoplastic: Secondary | ICD-10-CM | POA: Diagnosis not present

## 2021-05-16 NOTE — Patient Instructions (Addendum)
Start Arazlo lotion at bedtime, wash off in morning.  ? ?Recommend daily broad spectrum sunscreen SPF 30+ to sun-exposed areas, reapply every 2 hours as needed. Call for new or changing lesions.  ?Staying in the shade or wearing long sleeves, sun glasses (UVA+UVB protection) and wide brim hats (4-inch brim around the entire circumference of the hat) are also recommended for sun protection.  ? ?If You Need Anything After Your Visit ? ?If you have any questions or concerns for your doctor, please call our main line at [phone] and press option 4 to reach your doctor's medical assistant. If no one answers, please leave a voicemail as directed and we will return your call as soon as possible. Messages left after 4 pm will be answered the following business day.  ? ?You may also send [place] a message via MyChart. We typically respond to MyChart messages within 1-2 business days. ? ?For prescription refills, please ask your pharmacy to contact our office. Our fax number is [phone]. ? ?If you have an urgent issue when the clinic is closed that cannot wait until the next business day, you can page your doctor at the number below.   ? ?Please note that while we do our best to be available for urgent issues outside of office hours, we are not available 24/7.  ? ?If you have an urgent issue and are unable to reach [place], you may choose to seek medical care at your doctor's office, retail clinic, urgent care center, or emergency room. ? ?If you have a medical emergency, please immediately call 911 or go to the emergency department. ? ?Pager Numbers ? ?- Dr. Nehemiah Massed: [phone] ? ?- Dr. Laurence Ferrari: [phone] ? ?- Dr. Nicole Kindred: [phone] ? ?In the event of inclement weather, please call our main line at [phone] for an update on the status of any delays or closures. ? ?Dermatology Medication Tips: ?Please keep the boxes that topical medications come in in order to help keep track of the instructions about where and how to  use these. Pharmacies typically print the medication instructions only on the boxes and not directly on the medication tubes.  ? ?If your medication is too expensive, please contact our office at [phone] option 4 or send [place] a message through [place].  ? ?We are unable to tell what your co-pay for medications will be in advance as this is different depending on your insurance coverage. However, we may be able to find a substitute medication at lower cost or fill out paperwork to get insurance to cover a needed medication.  ? ?If a prior authorization is required to get your medication covered by your insurance company, please allow [place] 1-2 business days to complete this process. ? ?Drug prices often vary depending on where the prescription is filled and some pharmacies may offer cheaper prices. ? ?The website www.goodrx.com contains coupons for medications through different pharmacies. The prices here do not account for what the cost may be with help from insurance (it may be cheaper with your insurance), but the website can give you the price if you did not use any insurance.  ?- You can print the associated coupon and take it with your prescription to the pharmacy.  ?- You may also stop by our office during regular business hours and pick up a GoodRx coupon card.  ?- If you need your prescription sent electronically to a different pharmacy, notify our office through [facility] or by phone at [phone] option 4. ? ? ? ? ?  Si Usted Necesita Algo Despu?s de Su Visita ? ?Tambi?n puede enviarnos un mensaje a trav?s de MyChart. Por lo general respondemos a los mensajes de MyChart en el transcurso de 1 a 2 d?as h?biles. ? ?Para renovar recetas, por favor pida a su farmacia que se ponga en contacto con nuestra oficina. Nuestro n?mero de fax es el [phone]. ? ?Si tiene un asunto urgente cuando la cl?nica est? cerrada y que no puede esperar hasta el siguiente d?a h?bil, puede llamar/localizar a su  doctor(a) al n?mero que aparece a continuaci?n.  ? ?Por favor, tenga en cuenta que aunque hacemos todo lo posible para estar disponibles para asuntos urgentes fuera del horario de oficina, no estamos disponibles las 24 horas del d?a, los 7 d?as de la semana.  ? ?Si tiene un problema urgente y no puede comunicarse con nosotros, puede optar por buscar atenci?n m?dica  en el consultorio de su doctor(a), en una cl?nica privada, en un centro de atenci?n urgente o en una sala de emergencias. ? ?Si tiene Engineer, maintenance (IT) m?dica, por favor llame inmediatamente al 911 o vaya a la sala de emergencias. ? ?N?meros de b?per ? ?- Dr. Nehemiah Massed: [phone] ? ?- Dra. Moye: [phone] ? ?- Dra. Nicole Kindred: [phone] ? ?En caso de inclemencias del tiempo, por favor llame a nuestra l?nea principal al [phone] para una actualizaci?n sobre el estado de cualquier retraso o cierre. ? ?Consejos para la medicaci?n en dermatolog?a: ?Por favor, guarde las cajas en las que vienen los medicamentos de uso t?pico para ayudarle a seguir las instrucciones sobre d?nde y c?mo usarlos. Las farmacias generalmente imprimen las instrucciones del medicamento s?lo en las cajas y no directamente en los tubos del [place].  ? ?Si su medicamento es muy caro, por favor, p?ngase en contacto con Zigmund Daniel llamando al [phone] y presione la opci?n 4 o env?enos un mensaje a trav?s de MyChart.  ? ?No podemos decirle cu?l ser? su copago por los medicamentos por adelantado ya que esto es diferente dependiendo de la cobertura de su seguro. Sin embargo, es posible que podamos encontrar un medicamento sustituto a Electrical engineer un formulario para que el seguro cubra el medicamento que se considera necesario.  ? ?Si se requiere Ardelia Mems autorizaci?n previa para que su compa??a de seguros [place] su medicamento, por favor perm?tanos de 1 a 2 d?as h?biles para completar este proceso. ? ?Los precios de los medicamentos var?an con frecuencia dependiendo del  Environmental consultant de d?nde se surte la receta y alguna farmacias pueden ofrecer precios m?s baratos. ? ?El sitio web www.goodrx.com tiene cupones para medicamentos de Airline pilot. Los precios aqu? no tienen en cuenta lo que podr?a costar con la ayuda del seguro (puede ser m?s barato con su seguro), pero el sitio web puede darle el precio si no utiliz? ning?n seguro.  ?- Puede imprimir el cup?n correspondiente y llevarlo con su receta a la farmacia.  ?- Tambi?n puede pasar por nuestra oficina durante el horario de atenci?n regular y recoger una tarjeta de cupones de GoodRx.  ?- Si necesita que su receta se env?e electr?nicamente a Chiropodist, informe a nuestra oficina a trav?s de MyChart de [place] o por tel?fono llamando al [phone] y presione la opci?n 4.  ?

## 2021-05-16 NOTE — Progress Notes (Signed)
? ?  Follow-Up Visit ?  ?Subjective  ?Jessica Frey is a 66 y.o. female who presents for the following: lesion (Left side of nose. Dur: 1 month. Looked like a white at first. Patient picked at area). ?The patient has spots, moles and lesions to be evaluated, some may be new or changing and the patient has concerns that these could be cancer. ? ?The following portions of the chart were reviewed this encounter and updated as appropriate:  Tobacco  Allergies  Meds  Problems  Med Hx  Surg Hx  Fam Hx   ?  ?Review of Systems: No other skin or systemic complaints except as noted in HPI or Assessment and Plan. ? ?Objective  ?Well appearing patient in no apparent distress; mood and affect are within normal limits. ? ?A focused examination was performed including face. Relevant physical exam findings are noted in the Assessment and Plan. ? ?left bridge of nose ?Telangiectasia with central plug ? ? ?Assessment & Plan  ?Sebaceous hyperplasia with associated telangiectasia and early milium/ comedonal plug - with history of trauma from squeezing. ?left bridge of nose ?By dermatoscopic exam and magnified exam.  ?Start Arazlo lotion qhs, wash off qam - sample given ?If persistent or concerning at follow up, consider biopsy ?Discussed BBL laser for telangiectasia component. ? ?Actinic Damage ?- chronic, secondary to cumulative UV radiation exposure/sun exposure over time ?- diffuse scaly erythematous macules with underlying dyspigmentation ?- Recommend daily broad spectrum sunscreen SPF 30+ to sun-exposed areas, reapply every 2 hours as needed.  ?- Recommend staying in the shade or wearing long sleeves, sun glasses (UVA+UVB protection) and wide brim hats (4-inch brim around the entire circumference of the hat). ?- Call for new or changing lesions. ? ?Return for nose recheck 2-3 months. ? ?I, Emelia Salisbury, CMA, am acting as scribe for Sarina Ser, MD. ?Documentation: I have reviewed the above documentation for accuracy and  completeness, and I agree with the above. ? ?Sarina Ser, MD ? ? ?

## 2021-05-17 ENCOUNTER — Encounter: Payer: Self-pay | Admitting: Dermatology

## 2021-07-11 ENCOUNTER — Encounter: Payer: Self-pay | Admitting: Gastroenterology

## 2021-08-11 ENCOUNTER — Ambulatory Visit: Payer: BC Managed Care – PPO | Admitting: Dermatology

## 2021-09-06 ENCOUNTER — Encounter: Payer: Self-pay | Admitting: Gastroenterology

## 2021-09-06 ENCOUNTER — Ambulatory Visit (AMBULATORY_SURGERY_CENTER): Payer: Self-pay | Admitting: *Deleted

## 2021-09-06 VITALS — Ht 63.0 in | Wt 135.6 lb

## 2021-09-06 DIAGNOSIS — Z86010 Personal history of colonic polyps: Secondary | ICD-10-CM

## 2021-09-06 MED ORDER — NA SULFATE-K SULFATE-MG SULF 17.5-3.13-1.6 GM/177ML PO SOLN: 1.0000 | Freq: Once | ORAL | 0 refills | Status: AC

## 2021-09-06 NOTE — Progress Notes (Signed)
No egg or soy allergy known to patient  ?No issues known to pt with past sedation with any surgeries or procedures ?Patient denies ever being told they had issues or difficulty with intubation  ?No FH of Malignant Hyperthermia ?Pt is not on diet pills ?Pt is not on  home 02  ?Pt is not on blood thinners  ?Pt denies issues with constipation  ?No A fib or A flutter ? ?SUPREP Coupon to pt in PV today , Code to Pharmacy and  NO PA's for preps discussed with pt In PV today  ?Discussed with pt there will be an out-of-pocket cost for prep and that varies from $0 to 70 +  dollars - pt verbalized understanding  ?Pt instructed to use Singlecare.com or GoodRx for a price reduction on prep  ? ?PV completed over the phone. Pt verified name, DOB, address and insurance during PV today.  ?Pt mailed instruction packet with copy of consent form to read and not return, and instructions.  ?Pt encouraged to call with questions or issues.  ?If pt has My chart, procedure instructions sent via My Chart  ?Insurance confirmed with pt at PV today   ?

## 2021-09-21 ENCOUNTER — Encounter: Payer: Self-pay | Admitting: Certified Registered Nurse Anesthetist

## 2021-09-27 ENCOUNTER — Encounter: Payer: Self-pay | Admitting: Gastroenterology

## 2021-09-27 ENCOUNTER — Ambulatory Visit (AMBULATORY_SURGERY_CENTER): Payer: BC Managed Care – PPO | Admitting: Gastroenterology

## 2021-09-27 VITALS — BP 130/83 | HR 57 | Temp 98.6°F | Resp 10 | Ht 63.0 in | Wt 135.6 lb

## 2021-09-27 DIAGNOSIS — Z86010 Personal history of colonic polyps: Secondary | ICD-10-CM | POA: Diagnosis not present

## 2021-09-27 DIAGNOSIS — Z09 Encounter for follow-up examination after completed treatment for conditions other than malignant neoplasm: Secondary | ICD-10-CM

## 2021-09-27 DIAGNOSIS — Z1211 Encounter for screening for malignant neoplasm of colon: Secondary | ICD-10-CM | POA: Diagnosis not present

## 2021-09-27 MED ORDER — SODIUM CHLORIDE 0.9 % IV SOLN: 500.0000 mL | INTRAVENOUS | Status: AC

## 2021-09-27 NOTE — Patient Instructions (Signed)
Information on diverticulosis given to you today.  Resume previous diet and medications.  Repeat colonoscopy in 10 years.   YOU HAD AN ENDOSCOPIC PROCEDURE TODAY AT [facility]:   Refer to the procedure report that was given to you for any specific questions about what was found during the examination.  If the procedure report does not answer your questions, please call your gastroenterologist to clarify.  If you requested that your care partner not be given the details of your procedure findings, then the procedure report has been included in a sealed envelope for you to review at your convenience later.  YOU SHOULD EXPECT: Some feelings of bloating in the abdomen. Passage of more gas than usual.  Walking can help get rid of the air that was put into your GI tract during the procedure and reduce the bloating. If you had a lower endoscopy (such as a colonoscopy or flexible sigmoidoscopy) you may notice spotting of blood in your stool or on the toilet paper. If you underwent a bowel prep for your procedure, you may not have a normal bowel movement for a few days.  Please Note:  You might notice some irritation and congestion in your nose or some drainage.  This is from the oxygen used during your procedure.  There is no need for concern and it should clear up in a day or so.  SYMPTOMS TO REPORT IMMEDIATELY:  Following lower endoscopy (colonoscopy or flexible sigmoidoscopy):  Excessive amounts of blood in the stool  Significant tenderness or worsening of abdominal pains  Swelling of the abdomen that is new, acute  Fever of 100F or higher    For urgent or emergent issues, a gastroenterologist can be reached at any hour by calling [phone]. Do not use MyChart messaging for urgent concerns.    DIET:  We do recommend a small meal at first, but then you may proceed to your regular diet.  Drink plenty of fluids but you should avoid alcoholic beverages for 24  hours.  ACTIVITY:  You should plan to take it easy for the rest of today and you should NOT DRIVE or use heavy machinery until tomorrow (because of the sedation medicines used during the test).    FOLLOW UP: Our staff will call the number listed on your records the next business day following your procedure.  We will call around 7:15- 8:00 am to check on you and address any questions or concerns that you may have regarding the information given to you following your procedure. If we do not reach you, we will leave a message.  If you develop any symptoms (ie: fever, flu-like symptoms, shortness of breath, cough etc.) before then, please call [phone].  If you test positive for Covid 19 in the 2 weeks post procedure, please call and report this information to [place].    If any biopsies were taken you will be contacted by phone or by letter within the next 1-3 weeks.  Please call us at [phone] if you have not heard about the biopsies in 3 weeks.    SIGNATURES/CONFIDENTIALITY: You and/or your care partner have signed paperwork which will be entered into your electronic medical record.  These signatures attest to the fact that that the information above on your After Visit Summary has been reviewed and is understood.  Full responsibility of the confidentiality of this discharge information lies with you and/or your care-partner.

## 2021-09-27 NOTE — Progress Notes (Signed)
Report given to PACU, vss 

## 2021-09-27 NOTE — Progress Notes (Signed)
0814 Patient experiencing nausea and retching.  MD updated and Zofran 4 mg IV given, vss

## 2021-09-27 NOTE — Op Note (Signed)
[place] Patient Name: Jessica Frey Procedure Date: 09/27/2021 8:03 AM MRN: [id number] Endoscopist: Remo Lipps P. Havery Moros , MD Age: 66 Referring MD:  Date of Birth: November 08, 1955 Gender: Female Account #: [id number] Procedure:                Colonoscopy Indications:              High risk colon cancer surveillance: Personal                            history of colonic polyps - sessile serrated polyp                            removed 09/2016 Medicines:                Monitored Anesthesia Care Procedure:                Pre-Anesthesia Assessment:                           - Prior to the procedure, a History and Physical                            was performed, and patient medications and                            allergies were reviewed. The patient's tolerance of                            previous anesthesia was also reviewed. The risks                            and benefits of the procedure and the sedation                            options and risks were discussed with the patient.                            All questions were answered, and informed consent                            was obtained. Prior Anticoagulants: The patient has                            taken no previous anticoagulant or antiplatelet                            agents. ASA Grade Assessment: I - A normal, healthy                            patient. After reviewing the risks and benefits,                            the patient was deemed in satisfactory condition to  undergo the procedure.                           After obtaining informed consent, the colonoscope                            was passed under direct vision. Throughout the                            procedure, the patient's blood pressure, pulse, and                            oxygen saturations were monitored continuously. The                            Olympus PCF-H190DL [id number]) Colonoscope was                             introduced through the anus and advanced to the the                            cecum, identified by appendiceal orifice and                            ileocecal valve. The colonoscopy was performed                            without difficulty. The patient tolerated the                            procedure well. The quality of the bowel                            preparation was good. The ileocecal valve,                            appendiceal orifice, and rectum were photographed. Scope In: 8:08:32 AM Scope Out: 8:25:35 AM Scope Withdrawal Time: 0 hours 11 minutes 9 seconds  Total Procedure Duration: 0 hours 17 minutes 3 seconds  Findings:                 The perianal and digital rectal examinations were                            normal.                           The colon was quite tortuous - pediatric                            colonoscope used for this exam.                           A few small-mouthed diverticula were found in the  sigmoid colon.                           The exam was otherwise without abnormality. Complications:            No immediate complications. Estimated blood loss:                            None. Estimated Blood Loss:     Estimated blood loss: none. Impression:               - Tortuous colon.                           - Diverticulosis in the sigmoid colon.                           - The examination was otherwise normal.                           - No polyps Recommendation:           - Patient has a contact number available for                            emergencies. The signs and symptoms of potential                            delayed complications were discussed with the                            patient. Return to normal activities tomorrow.                            Written discharge instructions were provided to the                            patient.                           - Resume previous diet.                            - Continue present medications.                           - Repeat colonoscopy in 10 years for surveillance. Remo Lipps P. Haiven Nardone, MD 09/27/2021 8:30:32 AM This report has been signed electronically.

## 2021-09-27 NOTE — Progress Notes (Signed)
[facility] History and Physical   Primary Care Physician:  Pcp, No   Reason for Procedure:   History of colon polyps  Plan:    colonoscopy     HPI: Jessica Frey is a 66 y.o. female  here for colonoscopy surveillance - history of SSP removed 09/2016. Patient denies any bowel symptoms at this time. No family history of colon cancer known. Otherwise feels well without any cardiopulmonary symptoms.    Past Medical History:  Diagnosis Date   Hot flashes    Menopause    Osteopenia    Vaginal atrophy     Past Surgical History:  Procedure Laterality Date   CESAREAN SECTION     [phone]   COLONOSCOPY     HERNIA REPAIR  1959   POLYPECTOMY     WISDOM TOOTH EXTRACTION      Prior to Admission medications   Medication Sig Start Date End Date Taking? Authorizing Provider  calcium-vitamin D 250-100 MG-UNIT per tablet Take 1 tablet by mouth 2 (two) times daily.   Yes [provider]  coconut oil OIL Apply 1 application topically as needed.   Yes [provider]  Collagen 500 MG CAPS Take by mouth daily.   Yes [provider]  geriatric multivitamins-minerals (ELDERTONIC/GEVRABON) ELIX Take 15 mLs by mouth daily.   Yes [provider]    Current Outpatient Medications  Medication Sig Dispense Refill   calcium-vitamin D 250-100 MG-UNIT per tablet Take 1 tablet by mouth 2 (two) times daily.     coconut oil OIL Apply 1 application topically as needed.     Collagen 500 MG CAPS Take by mouth daily.     geriatric multivitamins-minerals (ELDERTONIC/GEVRABON) ELIX Take 15 mLs by mouth daily.     Current Facility-Administered Medications  Medication Dose Route Frequency Provider Last Rate Last Admin   0.9 %  sodium chloride infusion  500 mL Intravenous Continuous Taia Bramlett, Carlota Raspberry, MD        Allergies as of 09/27/2021   (No Known Allergies)    Family History  Problem Relation Age of Onset   Heart disease Father    Cancer Neg Hx     Breast cancer Neg Hx    Colon cancer Neg Hx    Esophageal cancer Neg Hx    Rectal cancer Neg Hx    Stomach cancer Neg Hx    Pancreatic cancer Neg Hx    Colon polyps Neg Hx    Crohn's disease Neg Hx     Social History   Socioeconomic History   Marital status: Married    Spouse name: Not on file   Number of children: Not on file   Years of education: Not on file   Highest education level: Not on file  Occupational History   Not on file  Tobacco Use   Smoking status: Never    Passive exposure: Past   Smokeless tobacco: Never  Vaping Use   Vaping Use: Never used  Substance and Sexual Activity   Alcohol use: No   Drug use: No   Sexual activity: Yes    Birth control/protection: Post-menopausal  Other Topics Concern   Not on file  Social History Narrative   Not on file   Social Determinants of Health   Financial Resource Strain: Not on file  Food Insecurity: Not on file  Transportation Needs: Not on file  Physical Activity: Sufficiently Active (11/27/2017)   Exercise Vital Sign    Days of Exercise per Week:  6 days    Minutes of Exercise per Session: 30 min  Stress: Not on file  Social Connections: Not on file  Intimate Partner Violence: Not on file    Review of Systems: All other review of systems negative except as mentioned in the HPI.  Physical Exam: Vital signs BP (!) 145/95   Pulse 87   Temp 98.6 F (37 C)   Resp 20   Ht '5\' 3"'$  (1.6 m)   Wt 135 lb 9.6 oz (61.5 kg)   SpO2 98%   BMI 24.02 kg/m   General:   Alert,  Well-developed, pleasant and cooperative in NAD Lungs:  Clear throughout to auscultation.   Heart:  Regular rate and rhythm Abdomen:  Soft, nontender and nondistended.   Neuro/Psych:  Alert and cooperative. Normal mood and affect. A and O x 3  Jolly Mango, MD [facility]

## 2021-09-28 ENCOUNTER — Telehealth: Payer: Self-pay | Admitting: *Deleted

## 2021-09-28 NOTE — Telephone Encounter (Signed)
Left message on f/u call 

## 2022-03-27 ENCOUNTER — Other Ambulatory Visit: Payer: Self-pay | Admitting: Obstetrics and Gynecology

## 2022-03-27 DIAGNOSIS — Z1231 Encounter for screening mammogram for malignant neoplasm of breast: Secondary | ICD-10-CM

## 2022-03-27 NOTE — Progress Notes (Unsigned)
ANNUAL PREVENTATIVE CARE GYNECOLOGY  ENCOUNTER NOTE  Subjective:       Jessica Frey is a 67 y.o. G50P2002 female here for a routine annual gynecologic exam. The patient is sexually active. The patient has never been taking hormone replacement therapy. Patient denies post-menopausal vaginal bleeding. The patient wears seatbelts: yes. The patient participates in regular exercise: yes. Has the patient ever been transfused or tattooed?: no.    Current complaints: 1.  She has no new concerns today    Gynecologic History No LMP recorded. Patient is postmenopausal. Contraception: post menopausal status Last Pap: 03/24/2021. Results were: normal Last mammogram: 04/28/2021. Results were: normal Last Colonoscopy: 09/27/2021: Results were: normal. Repeat in 10 years Last Dexa Scan: 2022 (performed at [facility]).  Notes some Osteopenia.      Obstetric History OB History  Gravida Para Term Preterm AB Living  '2 2 2     2  '$ SAB IAB Ectopic Multiple Live Births          2    # Outcome Date GA Lbr Len/2nd Weight Sex Delivery Anes PTL Lv  2 Term 1992    F CS-LTranv   LIV  1 Term 19    M CS-LTranv   LIV    Past Medical History:  Diagnosis Date   Hot flashes    Menopause    Osteopenia    Vaginal atrophy     Family History  Problem Relation Age of Onset   Heart disease Father    Cancer Neg Hx    Breast cancer Neg Hx    Colon cancer Neg Hx    Esophageal cancer Neg Hx    Rectal cancer Neg Hx    Stomach cancer Neg Hx    Pancreatic cancer Neg Hx    Colon polyps Neg Hx    Crohn's disease Neg Hx     Past Surgical History:  Procedure Laterality Date   CESAREAN SECTION     [id number],2641   COLONOSCOPY     HERNIA REPAIR  1959   POLYPECTOMY     WISDOM TOOTH EXTRACTION      Social History   Socioeconomic History   Marital status: Married    Spouse name: Not on file   Number of children: Not on file   Years of education: Not on file   Highest education level: Not on file  Occupational  History   Not on file  Tobacco Use   Smoking status: Never    Passive exposure: Past   Smokeless tobacco: Never  Vaping Use   Vaping Use: Never used  Substance and Sexual Activity   Alcohol use: No   Drug use: No   Sexual activity: Yes    Birth control/protection: Post-menopausal  Other Topics Concern   Not on file  Social History Narrative   Not on file   Social Determinants of Health   Financial Resource Strain: Not on file  Food Insecurity: Not on file  Transportation Needs: Not on file  Physical Activity: Sufficiently Active (11/27/2017)   Exercise Vital Sign    Days of Exercise per Week: 6 days    Minutes of Exercise per Session: 30 min  Stress: Not on file  Social Connections: Not on file  Intimate Partner Violence: Not on file    Current Outpatient Medications on File Prior to Visit  Medication Sig Dispense Refill   calcium-vitamin D 250-100 MG-UNIT per tablet Take 1 tablet by mouth 2 (two) times daily.  coconut oil OIL Apply 1 application topically as needed.     Collagen 500 MG CAPS Take by mouth daily.     geriatric multivitamins-minerals (ELDERTONIC/GEVRABON) ELIX Take 15 mLs by mouth daily.     Current Facility-Administered Medications on File Prior to Visit  Medication Dose Route Frequency Provider Last Rate Last Admin   0.9 %  sodium chloride infusion  500 mL Intravenous Continuous Armbruster, Carlota Raspberry, MD        No Known Allergies    Review of Systems ROS Review of Systems - General ROS: negative for - chills, fatigue, fever, hot flashes, night sweats, weight gain or weight loss Psychological ROS: negative for - anxiety, decreased libido, depression, mood swings, physical abuse or sexual abuse Ophthalmic ROS: negative for - blurry vision, eye pain or loss of vision ENT ROS: negative for - headaches, hearing change, visual changes or vocal changes Allergy and Immunology ROS: negative for - hives, itchy/watery eyes or seasonal  allergies Hematological and Lymphatic ROS: negative for - bleeding problems, bruising, swollen lymph nodes or weight loss Endocrine ROS: negative for - galactorrhea, hair pattern changes, hot flashes, malaise/lethargy, mood swings, palpitations, polydipsia/polyuria, skin changes, temperature intolerance or unexpected weight changes Breast ROS: negative for - new or changing breast lumps or nipple discharge Respiratory ROS: negative for - cough or shortness of breath Cardiovascular ROS: negative for - chest pain, irregular heartbeat, palpitations or shortness of breath Gastrointestinal ROS: no abdominal pain, change in bowel habits, or black or bloody stools Genito-Urinary ROS: no dysuria, trouble voiding, or hematuria Musculoskeletal ROS: negative for - joint pain or joint stiffness Neurological ROS: negative for - bowel and bladder control changes Dermatological ROS: negative for rash and skin lesion changes   Objective:   BP 132/76   Pulse 98   Resp 16   Ht '5\' 3"'$  (1.6 m)   Wt 135 lb 9.6 oz (61.5 kg)   BMI 24.02 kg/m  CONSTITUTIONAL: Well-developed, well-nourished female in no acute distress.  PSYCHIATRIC: Normal mood and affect. Normal behavior. Normal judgment and thought content. [place]: Alert and oriented to person, place, and time. Normal muscle tone coordination. No cranial nerve deficit noted. HENT:  Normocephalic, atraumatic, External right and left ear normal. Oropharynx is clear and moist EYES: Conjunctivae and EOM are normal. Pupils are equal, round, and reactive to light. No scleral icterus.  NECK: Normal range of motion, supple, no masses.  Normal thyroid.  SKIN: Skin is warm and dry. No rash noted. Not diaphoretic. No erythema. No pallor. CARDIOVASCULAR: Normal heart rate noted, regular rhythm, no murmur. RESPIRATORY: Clear to auscultation bilaterally. Effort and breath sounds normal, no problems with respiration noted. BREASTS: Symmetric in size. No masses, skin  changes, nipple drainage, or lymphadenopathy. ABDOMEN: Soft, normal bowel sounds, no distention noted.  No tenderness, rebound or guarding.  BLADDER: Normal PELVIC:  Bladder no bladder distension noted  Urethra: normal appearing urethra with no masses, tenderness or lesions  Vulva: normal appearing vulva with no masses, tenderness or lesions  Vagina: no speculum performed. Digital exam without masses or tenderness, narrowed vaginal canal.  Cervix: deferred  Uterus: non-tender, non-tender, mobile, normal shape and size.  Adnexa: normal adnexa in size, nontender and no masses  RV:  deferred   MUSCULOSKELETAL: Normal range of motion. No tenderness.  No cyanosis, clubbing, or edema.  2+ distal pulses. LYMPHATIC: No Axillary, Supraclavicular, or Inguinal Adenopathy.   Labs: Performed at outside lab [facility]).    Assessment:   1. Encounter for well woman  exam with routine gynecological exam   2. Osteopenia, unspecified location   3. Vaginal atrophy      Plan:  Pap: Not needed. Patient has aged out of screening. Last pap up to date, no prior h/o severe dysplasia.  Mammogram: Up to date. Due in 1 month. Ordered Colon Screening:   UTD .  Labs:  None ordered. Has labs performed with job. Routine preventative health maintenance measures emphasized: Exercise/Diet/Weight control, Tobacco Warnings, Alcohol/Substance use risks, Stress Management. Vaginal atrophy, minimal symptoms.  COVID Vaccination status:  Declined.  Flu vaccine: Up to date.  Recommend pneumonia vaccine. Patient has questions regarding RSV vaccine. All questions answered.  Osteopenia, continue to encourage Vitamin D/calcium supplementation, weight bearing exercises.  Return to [place].    Rubie Maid, MD [place]

## 2022-03-28 ENCOUNTER — Ambulatory Visit (INDEPENDENT_AMBULATORY_CARE_PROVIDER_SITE_OTHER): Payer: BC Managed Care – PPO | Admitting: Obstetrics and Gynecology

## 2022-03-28 ENCOUNTER — Encounter: Payer: Self-pay | Admitting: Obstetrics and Gynecology

## 2022-03-28 VITALS — BP 132/76 | HR 98 | Resp 16 | Ht 63.0 in | Wt 135.6 lb

## 2022-03-28 DIAGNOSIS — N952 Postmenopausal atrophic vaginitis: Secondary | ICD-10-CM | POA: Diagnosis not present

## 2022-03-28 DIAGNOSIS — Z01419 Encounter for gynecological examination (general) (routine) without abnormal findings: Secondary | ICD-10-CM

## 2022-03-28 DIAGNOSIS — Z131 Encounter for screening for diabetes mellitus: Secondary | ICD-10-CM

## 2022-03-28 DIAGNOSIS — Z780 Asymptomatic menopausal state: Secondary | ICD-10-CM

## 2022-03-28 DIAGNOSIS — Z13220 Encounter for screening for lipoid disorders: Secondary | ICD-10-CM

## 2022-03-28 DIAGNOSIS — Z1231 Encounter for screening mammogram for malignant neoplasm of breast: Secondary | ICD-10-CM

## 2022-03-28 DIAGNOSIS — M8580 Other specified disorders of bone density and structure, unspecified site: Secondary | ICD-10-CM

## 2022-03-28 DIAGNOSIS — R030 Elevated blood-pressure reading, without diagnosis of hypertension: Secondary | ICD-10-CM

## 2022-05-01 ENCOUNTER — Ambulatory Visit
Admission: RE | Admit: 2022-05-01 | Discharge: 2022-05-01 | Disposition: A | Discharge status: Home / Self Care | Payer: BC Managed Care – PPO | Source: Ambulatory Visit | Attending: Obstetrics and Gynecology | Admitting: Obstetrics and Gynecology

## 2022-05-01 DIAGNOSIS — Z1231 Encounter for screening mammogram for malignant neoplasm of breast: Secondary | ICD-10-CM | POA: Diagnosis not present

## 2022-10-16 ENCOUNTER — Ambulatory Visit: Payer: BC Managed Care – PPO | Admitting: Dermatology

## 2022-10-16 VITALS — BP 118/78 | HR 102

## 2022-10-16 DIAGNOSIS — L814 Other melanin hyperpigmentation: Secondary | ICD-10-CM

## 2022-10-16 DIAGNOSIS — L578 Other skin changes due to chronic exposure to nonionizing radiation: Secondary | ICD-10-CM | POA: Diagnosis not present

## 2022-10-16 DIAGNOSIS — W908XXA Exposure to other nonionizing radiation, initial encounter: Secondary | ICD-10-CM

## 2022-10-16 DIAGNOSIS — Z79899 Other long term (current) drug therapy: Secondary | ICD-10-CM

## 2022-10-16 DIAGNOSIS — L821 Other seborrheic keratosis: Secondary | ICD-10-CM | POA: Diagnosis not present

## 2022-10-16 DIAGNOSIS — L820 Inflamed seborrheic keratosis: Secondary | ICD-10-CM

## 2022-10-16 DIAGNOSIS — B001 Herpesviral vesicular dermatitis: Secondary | ICD-10-CM | POA: Diagnosis not present

## 2022-10-16 DIAGNOSIS — B009 Herpesviral infection, unspecified: Secondary | ICD-10-CM

## 2022-10-16 DIAGNOSIS — Z7189 Other specified counseling: Secondary | ICD-10-CM

## 2022-10-16 MED ORDER — ACYCLOVIR 5 % EX CREA: TOPICAL_CREAM | CUTANEOUS | 11 refills | Status: DC

## 2022-10-16 NOTE — Patient Instructions (Addendum)
Herpes Simplex Virus = Cold Sores = Fever Blisters is a chronic recurring blistering; scabbing sore-producing viral infection that is recurrent usually in the same area triggered by stress, sun/UV exposure and trauma.  It is infectious and can be spread from person to person by direct contact.  It is not curable, but is treatable with topical and oral medication.  Keep areas well moisturized Aquaphor or vaseline   Start acyclovir cream apply topically to affected areas at first onset of symptoms 5 times daily for 5 days     Seborrheic Keratosis  What causes seborrheic keratoses? Seborrheic keratoses are harmless, common skin growths that first appear during adult life.  As time goes by, more growths appear.  Some people may develop a large number of them.  Seborrheic keratoses appear on both covered and uncovered body parts.  They are not caused by sunlight.  The tendency to develop seborrheic keratoses can be inherited.  They vary in color from skin-colored to gray, brown, or even black.  They can be either smooth or have a rough, warty surface.   Seborrheic keratoses are superficial and look as if they were stuck on the skin.  Under the microscope this type of keratosis looks like layers upon layers of skin.  That is why at times the top layer may seem to fall off, but the rest of the growth remains and re-grows.    Treatment Seborrheic keratoses do not need to be treated, but can easily be removed in the office.  Seborrheic keratoses often cause symptoms when they rub on clothing or jewelry.  Lesions can be in the way of shaving.  If they become inflamed, they can cause itching, soreness, or burning.  Removal of a seborrheic keratosis can be accomplished by freezing, burning, or surgery. If any spot bleeds, scabs, or grows rapidly, please return to have it checked, as these can be an indication of a skin cancer.   Cryotherapy Aftercare  Wash gently with soap and water everyday.   Apply  Vaseline and Band-Aid daily until healed.   Melanoma ABCDEs  Melanoma is the most dangerous type of skin cancer, and is the leading cause of death from skin disease.  You are more likely to develop melanoma if you: Have light-colored skin, light-colored eyes, or red or blond hair Spend a lot of time in the sun Tan regularly, either outdoors or in a tanning bed Have had blistering sunburns, especially during childhood Have a close family member who has had a melanoma Have atypical moles or large birthmarks  Early detection of melanoma is key since treatment is typically straightforward and cure rates are extremely high if we catch it early.   The first sign of melanoma is often a change in a mole or a new dark spot.  The ABCDE system is a way of remembering the signs of melanoma.  A for asymmetry:  The two halves do not match. B for border:  The edges of the growth are irregular. C for color:  A mixture of colors are present instead of an even brown color. D for diameter:  Melanomas are usually (but not always) greater than 6mm - the size of a pencil eraser. E for evolution:  The spot keeps changing in size, shape, and color.  Please check your skin once per month between visits. You can use a small mirror in front and a large mirror behind you to keep an eye on the back side or your body.  If you see any new or changing lesions before your next follow-up, please call to schedule a visit.  Please continue daily skin protection including broad spectrum sunscreen SPF 30+ to sun-exposed areas, reapplying every 2 hours as needed when you're outdoors.   Staying in the shade or wearing long sleeves, sun glasses (UVA+UVB protection) and wide brim hats (4-inch brim around the entire circumference of the hat) are also recommended for sun protection.     Due to recent changes in healthcare laws, you may see results of your pathology and/or laboratory studies on MyChart before the doctors have  had a chance to review them. We understand that in some cases there may be results that are confusing or concerning to you. Please understand that not all results are received at the same time and often the doctors may need to interpret multiple results in order to provide you with the best plan of care or course of treatment. Therefore, we ask that you please give [place] 2 business days to thoroughly review all your results before contacting the office for clarification. Should we see a critical lab result, you will be contacted sooner.   If You Need Anything After Your Visit  If you have any questions or concerns for your doctor, please call our main line at [phone] and press option 4 to reach your doctor's medical assistant. If no one answers, please leave a voicemail as directed and we will return your call as soon as possible. Messages left after 4 pm will be answered the following business day.   You may also send [place] a message via MyChart. We typically respond to MyChart messages within 1-2 business days.  For prescription refills, please ask your pharmacy to contact our office. Our fax number is [phone].  If you have an urgent issue when the clinic is closed that cannot wait until the next business day, you can page your doctor at the number below.    Please note that while we do our best to be available for urgent issues outside of office hours, we are not available 24/7.   If you have an urgent issue and are unable to reach [place], you may choose to seek medical care at your doctor's office, retail clinic, urgent care center, or emergency room.  If you have a medical emergency, please immediately call 911 or go to the emergency department.  Pager Numbers  - Dr. Gwen Pounds: [phone]  - Dr. Roseanne Reno: [phone]  In the event of inclement weather, please call our main line at [phone] for an update on the status of any delays or closures.  Dermatology Medication  Tips: Please keep the boxes that topical medications come in in order to help keep track of the instructions about where and how to use these. Pharmacies typically print the medication instructions only on the boxes and not directly on the medication tubes.   If your medication is too expensive, please contact our office at [phone] option 4 or send [place] a message through MyChart.   We are unable to tell what your co-pay for medications will be in advance as this is different depending on your insurance coverage. However, we may be able to find a substitute medication at lower cost or fill out paperwork to get insurance to cover a needed medication.   If a prior authorization is required to get your medication covered by your insurance company, please allow [place] 1-2 business days to complete this process.  Drug prices often vary  depending on where the prescription is filled and some pharmacies may offer cheaper prices.  The website www.goodrx.com contains coupons for medications through different pharmacies. The prices here do not account for what the cost may be with help from insurance (it may be cheaper with your insurance), but the website can give you the price if you did not use any insurance.  - You can print the associated coupon and take it with your prescription to the pharmacy.  - You may also stop by our office during regular business hours and pick up a GoodRx coupon card.  - If you need your prescription sent electronically to a different pharmacy, notify our office through [facility] or by phone at [phone] option 4.     Si Usted Necesita Algo Despus de Su Visita  Tambin puede enviarnos un mensaje a travs de Clinical cytogeneticist. Por lo general respondemos a los mensajes de MyChart en el transcurso de 1 a 2 das hbiles.  Para renovar recetas, por favor pida a su farmacia que se ponga en contacto con nuestra oficina. Annie Sable de fax es [place] [phone].  Si tiene un  asunto urgente cuando la clnica est cerrada y que no puede esperar hasta el siguiente da hbil, puede llamar/localizar a su doctor(a) al nmero que aparece a continuacin.   Por favor, tenga en cuenta que aunque hacemos todo lo posible para estar disponibles para asuntos urgentes fuera del horario de [place], no estamos disponibles las 24 horas del da, los 7 [address]  [address] de la [place].   Si tiene un problema urgente y no puede comunicarse con nosotros, puede optar por buscar atencin mdica  en el consultorio de su doctor(a), en una clnica privada, en un centro de atencin urgente o en una sala de emergencias.  Si tiene Engineer, drilling, por favor llame inmediatamente al 911 o vaya a la sala de emergencias.  Nmeros de bper  - Dr. Gwen Pounds: [phone]  - Dra. Roseanne Reno: [phone]  En caso de inclemencias del [place], por favor llame a Lacy Duverney principal al [phone] para una actualizacin sobre el [place] de cualquier retraso o cierre.  Consejos para la medicacin en dermatologa: Por favor, guarde las cajas en las que vienen los medicamentos de uso tpico para ayudarle a seguir las instrucciones sobre dnde y cmo usarlos. Las farmacias generalmente imprimen las instrucciones del medicamento slo en las cajas y no directamente en los tubos del [place].   Si su medicamento es muy caro, por favor, pngase en contacto con Rolm Gala llamando al [phone] y presione la opcin 4 o envenos un mensaje a travs de Clinical cytogeneticist.   No podemos decirle cul ser su copago por los medicamentos por adelantado ya que esto es diferente dependiendo de la cobertura de su seguro. Sin embargo, es posible que podamos encontrar un medicamento sustituto a Audiological scientist un formulario para que el seguro cubra el medicamento que se considera necesario.   Si se requiere una autorizacin previa para que su compaa de seguros [place] su medicamento, por favor permtanos de 1 a 2 das hbiles para  completar [address]  Los precios de los medicamentos varan con frecuencia dependiendo del Environmental consultant de dnde se surte la receta y alguna farmacias pueden ofrecer precios ms baratos.  El sitio web www.goodrx.com tiene cupones para medicamentos de Health and safety inspector. Los precios aqu no tienen en cuenta lo que podra costar con la ayuda del seguro (puede ser ms barato con su seguro), pero el sitio web puede  darle el precio si no utiliz [employer].  - Puede imprimir el cupn correspondiente y llevarlo con su receta a la farmacia.  - Tambin puede pasar por nuestra oficina durante el horario de atencin regular y Education officer, museum una tarjeta de cupones de GoodRx.  - Si necesita que su receta se enve electrnicamente a una farmacia diferente, informe a nuestra oficina a travs de MyChart de [place] o por telfono llamando al [phone] y presione la opcin 4.

## 2022-10-16 NOTE — Progress Notes (Signed)
Follow-Up Visit   Subjective  Jessica Frey is a 67 y.o. female who presents for the following: a spot at right temple and reports cold sore at lip The patient has spots, moles and lesions to be evaluated, some may be new or changing and the patient may have concern these could be cancer.  The following portions of the chart were reviewed this encounter and updated as appropriate: medications, allergies, medical history  Review of Systems:  No other skin or systemic complaints except as noted in HPI or Assessment and Plan.  Objective  Well appearing patient in no apparent distress; mood and affect are within normal limits. A focused examination was performed of the following areas: face , lips Relevant exam findings are noted in the Assessment and Plan.  right temple  x3, right mandible x 1 (4) Erythematous stuck-on, waxy papule or plaque   Assessment & Plan   Inflamed seborrheic keratosis (4) right temple  x3, right mandible x 1  Symptomatic, irritating, patient would like treated.  Destruction of lesion - right temple  x3, right mandible x 1 (4)  Destruction method: cryotherapy   Informed consent: discussed and consent obtained   Lesion destroyed using liquid nitrogen: Yes   Region frozen until ice ball extended beyond lesion: Yes   Outcome: patient tolerated procedure well with no complications   Post-procedure details: wound care instructions given   Additional details:  Prior to procedure, discussed risks of blister formation, small wound, skin dyspigmentation, or rare scar following cryotherapy. Recommend Vaseline ointment to treated areas while healing.   SEBORRHEIC KERATOSIS - Stuck-on, waxy, tan-brown papules and/or plaques  - Benign-appearing - Discussed benign etiology and prognosis. - Observe - Call for any changes  LENTIGINES Exam: scattered tan macules Due to sun exposure Treatment Plan: Benign-appearing, observe. Recommend daily broad spectrum sunscreen  SPF 30+ to sun-exposed areas, reapply every 2 hours as needed.  Call for any changes  HERPESVIRAL INFECTION (COLD SORES) Exam Clear today at left corner of lip Chronic and persistent condition with duration or expected duration over one year. Condition is bothersome/symptomatic for patient. Currently flared. Herpes Simplex Virus = Cold Sores = Fever Blisters is a chronic recurring blistering; scabbing sore-producing viral infection that is recurrent usually in the same area triggered by stress, sun/UV exposure and trauma.  It is infectious and can be spread from person to person by direct contact.  It is not curable, but is treatable with topical and oral medication.  Treatment Plan Start acyclovir cream - apply to affected area 5 times daily at first onset of symptoms then continue 5 days Discussed oral antiviral treatment, but pt declines today. Recommend using a  moisturizer daily to dry areas like  aquaphor / Vaseline   HSV infection  Related Medications acyclovir cream (ZOVIRAX) 5 % Use topically to affected areas 5 x daily at first onset of symptoms and then for 5 days.   ACTINIC DAMAGE - chronic, secondary to cumulative UV radiation exposure/sun exposure over time - diffuse scaly erythematous macules with underlying dyspigmentation - Recommend daily broad spectrum sunscreen SPF 30+ to sun-exposed areas, reapply every 2 hours as needed.  - Recommend staying in the shade or wearing long sleeves, sun glasses (UVA+UVB protection) and wide brim hats (4-inch brim around the entire circumference of the hat). - Call for new or changing lesions.  Return in about 1 year (around 10/16/2023) for hsv and isk follow up.  I, Asher Muir, CMA, am acting as scribe for [employer]  Gwen Pounds, MD.  Documentation: I have reviewed the above documentation for accuracy and completeness, and I agree with the above.  Armida Sans, MD

## 2022-10-17 ENCOUNTER — Encounter: Payer: Self-pay | Admitting: Dermatology

## 2023-03-26 ENCOUNTER — Telehealth: Payer: Self-pay

## 2023-03-26 MED ORDER — ACYCLOVIR 5 % EX OINT: 1.0000 | TOPICAL_OINTMENT | CUTANEOUS | 2 refills | Status: AC

## 2023-03-26 NOTE — Telephone Encounter (Signed)
 Acyclovir Cream not covered by insurance this year. The ointment is on formulary. Medication change made.

## 2023-04-03 ENCOUNTER — Ambulatory Visit: Payer: BC Managed Care – PPO | Admitting: Obstetrics and Gynecology

## 2023-04-16 NOTE — Patient Instructions (Addendum)
Preventive Care 65 Years and Older, Female Preventive care refers to lifestyle choices and visits with your health care provider that can promote health and wellness. Preventive care visits are also called wellness exams. What can I expect for my preventive care visit? Counseling Your health care provider may ask you questions about your: Medical history, including: Past medical problems. Family medical history. Pregnancy and menstrual history. History of falls. Current health, including: Memory and ability to understand (cognition). Emotional well-being. Home life and relationship well-being. Sexual activity and sexual health. Lifestyle, including: Alcohol, nicotine or tobacco, and drug use. Access to firearms. Diet, exercise, and sleep habits. Work and work environment. Sunscreen use. Safety issues such as seatbelt and bike helmet use. Physical exam Your health care provider will check your: Height and weight. These may be used to calculate your BMI (body mass index). BMI is a measurement that tells if you are at a healthy weight. Waist circumference. This measures the distance around your waistline. This measurement also tells if you are at a healthy weight and may help predict your risk of certain diseases, such as type 2 diabetes and high blood pressure. Heart rate and blood pressure. Body temperature. Skin for abnormal spots. What immunizations do I need?  Vaccines are usually given at various ages, according to a schedule. Your health care provider will recommend vaccines for you based on your age, medical history, and lifestyle or other factors, such as travel or where you work. What tests do I need? Screening Your health care provider may recommend screening tests for certain conditions. This may include: Lipid and cholesterol levels. Hepatitis C test. Hepatitis B test. HIV (human immunodeficiency virus) test. STI (sexually transmitted infection) testing, if you are at  risk. Lung cancer screening. Colorectal cancer screening. Diabetes screening. This is done by checking your blood sugar (glucose) after you have not eaten for a while (fasting). Mammogram. Talk with your health care provider about how often you should have regular mammograms. BRCA-related cancer screening. This may be done if you have a family history of breast, ovarian, tubal, or peritoneal cancers. Bone density scan. This is done to screen for osteoporosis. Talk with your health care provider about your test results, treatment options, and if necessary, the need for more tests. Follow these instructions at home: Eating and drinking  Eat a diet that includes fresh fruits and vegetables, whole grains, lean protein, and low-fat dairy products. Limit your intake of foods with high amounts of sugar, saturated fats, and salt. Take vitamin and mineral supplements as recommended by your health care provider. Do not drink alcohol if your health care provider tells you not to drink. If you drink alcohol: Limit how much you have to 0-1 drink a day. Know how much alcohol is in your drink. In the [place] one drink equals one 12 oz bottle of beer (355 mL), one 5 oz glass of wine (148 mL), or one 1 oz glass of hard liquor (44 mL). Lifestyle Brush your teeth every morning and night with fluoride toothpaste. Floss one time each day. Exercise for at least 30 minutes 5 or more days each week. Do not use any products that contain nicotine or tobacco. These products include cigarettes, chewing tobacco, and vaping devices, such as e-cigarettes. If you need help quitting, ask your health care provider. Do not use drugs. If you are sexually active, practice safe sex. Use a condom or other form of protection in order to prevent STIs. Take aspirin only as told by   your health care provider. Make sure that you understand how much to take and what form to take. Work with your health care provider to find out whether it  is safe and beneficial for you to take aspirin daily. Ask your health care provider if you need to take a cholesterol-lowering medicine (statin). Find healthy ways to manage stress, such as: Meditation, yoga, or listening to music. Journaling. Talking to a trusted person. Spending time with friends and family. Minimize exposure to UV radiation to reduce your risk of skin cancer. Safety Always wear your seat belt while driving or riding in a vehicle. Do not drive: If you have been drinking alcohol. Do not ride with someone who has been drinking. When you are tired or distracted. While texting. If you have been using any mind-altering substances or drugs. Wear a helmet and other protective equipment during sports activities. If you have firearms in your house, make sure you follow all gun safety procedures. What's next? Visit your health care provider once a year for an annual wellness visit. Ask your health care provider how often you should have your eyes and teeth checked. Stay up to date on all vaccines. This information is not intended to replace advice given to you by your health care provider. Make sure you discuss any questions you have with your health care provider. Document Revised: 08/25/2020 Document Reviewed: 08/25/2020 Elsevier Patient Education  2024 Elsevier Inc. Breast Self-Awareness Breast self-awareness is knowing how your breasts look and feel. You need to: Check your breasts on a regular basis. Tell your doctor about any changes. Become familiar with the look and feel of your breasts. This can help you catch a breast problem while it is still small and can be treated. You should do breast self-exams even if you have breast implants. What you need: A mirror. A well-lit room. A pillow or other soft object. How to do a breast self-exam Follow these steps to do a breast self-exam: Look for changes  Take off all the clothes above your waist. Stand in front of a  mirror in a room with good lighting. Put your hands down at your sides. Compare your breasts in the mirror. Look for any difference between them, such as: A difference in shape. A difference in size. Wrinkles, dips, and bumps in one breast and not the other. Look at each breast for changes in the skin, such as: Redness. Scaly areas. Skin that has gotten thicker. Dimpling. Open sores (ulcers). Look for changes in your nipples, such as: Fluid coming out of a nipple. Fluid around a nipple. Bleeding. Dimpling. Redness. A nipple that looks pushed in (retracted), or that has changed position. Feel for changes Lie on your back. Feel each breast. To do this: Pick a breast to feel. Place a pillow under the shoulder closest to that breast. Put the arm closest to that breast behind your head. Feel the nipple area of that breast using the hand of your other arm. Feel the area with the pads of your three middle fingers by making small circles with your fingers. Use light, medium, and firm pressure. Continue the overlapping circles, moving downward over the breast. Keep making circles with your fingers. Stop when you feel your ribs. Start making circles with your fingers again, this time going upward until you reach your collarbone. Then, make circles outward across your breast and into your armpit area. Squeeze your nipple. Check for discharge and lumps. Repeat these steps to check your other   breast. Sit or stand in the tub or shower. With soapy water on your skin, feel each breast the same way you did when you were lying down. Write down what you find Writing down what you find can help you remember what to tell your doctor. Write down: What is normal for each breast. Any changes you find in each breast. These include: The kind of changes you find. A tender or painful breast. Any lump you find. Write down its size and where it is. When you last had your monthly period (menstrual  cycle). General tips If you are breastfeeding, the best time to check your breasts is after you feed your baby or after you use a breast pump. If you get monthly bleeding, the best time to check your breasts is 5-7 days after your monthly cycle ends. With time, you will become comfortable with the self-exam. You will also start to know if there are changes in your breasts. Contact a doctor if: You see a change in the shape or size of your breasts or nipples. You see a change in the skin of your breast or nipples, such as red or scaly skin. You have fluid coming from your nipples that is not normal. You find a new lump or thick area. You have breast pain. You have any concerns about your breast health. Summary Breast self-awareness includes looking for changes in your breasts and feeling for changes within your breasts. You should do breast self-awareness in front of a mirror in a well-lit room. If you get monthly periods (menstrual cycles), the best time to check your breasts is 5-7 days after your period ends. Tell your doctor about any changes you see in your breasts. Changes include changes in size, changes on the skin, painful or tender breasts, or fluid from your nipples that is not normal. This information is not intended to replace advice given to you by your health care provider. Make sure you discuss any questions you have with your health care provider. Document Revised: 08/04/2021 Document Reviewed: 12/30/2020 Elsevier Patient Education  2024 Elsevier Inc.  

## 2023-04-17 NOTE — Progress Notes (Signed)
 ANNUAL PREVENTATIVE CARE GYNECOLOGY  ENCOUNTER NOTE  Subjective:       Jessica Frey is a 68 y.o. G18P2002 female here for a routine annual gynecologic exam. The patient is sexually active. The patient is not taking hormone replacement therapy. Patient denies post-menopausal vaginal bleeding. The patient wears seatbelts: yes. The patient participates in regular exercise: yes. Has the patient ever been transfused or tattooed?: no. The patient reports that there is not domestic violence in her life.  Current complaints: 1.  None.  Notes that she is thinking about retiring in the next 1 to 2 years.   Gynecologic History No LMP recorded. Patient is postmenopausal. Contraception: post menopausal status Last Pap: 03/24/2021. Results were: normal Last mammogram: 05/01/2022. Results were: normal Last Colonoscopy: 09/27/2021.  Results were: normal.  Last Dexa Scan: 2023: Osteopenia present. Performed at her job.    Obstetric History OB History  Gravida Para Term Preterm AB Living  2 2 2   2   SAB IAB Ectopic Multiple Live Births      2    # Outcome Date GA Lbr Len/2nd Weight Sex Type Anes PTL Lv  2 Term 1992    F CS-LTranv   LIV  1 Term 59    M CS-LTranv   LIV    Past Medical History:  Diagnosis Date   Hot flashes    Menopause    Osteopenia    Vaginal atrophy     Family History  Problem Relation Age of Onset   Heart disease Father    Cancer Neg Hx    Breast cancer Neg Hx    Colon cancer Neg Hx    Esophageal cancer Neg Hx    Rectal cancer Neg Hx    Stomach cancer Neg Hx    Pancreatic cancer Neg Hx    Colon polyps Neg Hx    Crohn's disease Neg Hx     Past Surgical History:  Procedure Laterality Date   CESAREAN SECTION     [id number]   COLONOSCOPY     HERNIA REPAIR  1959   POLYPECTOMY     WISDOM TOOTH EXTRACTION      Social History   Socioeconomic History   Marital status: Married    Spouse name: Not on file   Number of children: Not on file   Years of education:  Not on file   Highest education level: Not on file  Occupational History   Not on file  Tobacco Use   Smoking status: Never    Passive exposure: Past   Smokeless tobacco: Never  Vaping Use   Vaping status: Never Used  Substance and Sexual Activity   Alcohol use: No   Drug use: No   Sexual activity: Yes    Birth control/protection: Post-menopausal  Other Topics Concern   Not on file  Social History Narrative   Not on file   Social Drivers of Health   Financial Resource Strain: Not on file  Food Insecurity: Not on file  Transportation Needs: Not on file  Physical Activity: Sufficiently Active (11/27/2017)   Exercise Vital Sign    Days of Exercise per Week: 6 days    Minutes of Exercise per Session: 30 min  Stress: Not on file  Social Connections: Not on file  Intimate Partner Violence: Not on file    Current Outpatient Medications on File Prior to Visit  Medication Sig Dispense Refill   acyclovir  ointment (ZOVIRAX ) 5 % Apply 1 Application topically as directed. Use  topically to affected areas 5 x daily at first onset of symptoms and then for 5 days 30 g 2   calcium-vitamin D 250-100 MG-UNIT per tablet Take 1 tablet by mouth 2 (two) times daily.     coconut oil OIL Apply 1 application topically as needed.     Collagen 500 MG CAPS Take by mouth daily.     geriatric multivitamins-minerals (ELDERTONIC/GEVRABON) ELIX Take 15 mLs by mouth daily.     Current Facility-Administered Medications on File Prior to Visit  Medication Dose Route Frequency Provider Last Rate Last Admin   0.9 %  sodium chloride  infusion  500 mL Intravenous Continuous Armbruster, Elspeth SQUIBB, MD        No Known Allergies    Review of Systems ROS Review of Systems - General ROS: negative for - chills, fatigue, fever, hot flashes, night sweats, weight gain or weight loss Psychological ROS: negative for - anxiety, decreased libido, depression, mood swings, physical abuse or sexual abuse Ophthalmic ROS:  negative for - blurry vision, eye pain or loss of vision ENT ROS: negative for - headaches, hearing change, visual changes or vocal changes Allergy and Immunology ROS: negative for - hives, itchy/watery eyes or seasonal allergies Hematological and Lymphatic ROS: negative for - bleeding problems, bruising, swollen lymph nodes or weight loss Endocrine ROS: negative for - galactorrhea, hair pattern changes, hot flashes, malaise/lethargy, mood swings, palpitations, polydipsia/polyuria, skin changes, temperature intolerance or unexpected weight changes Breast ROS: negative for - new or changing breast lumps or nipple discharge Respiratory ROS: negative for - cough or shortness of breath Cardiovascular ROS: negative for - chest pain, irregular heartbeat, palpitations or shortness of breath Gastrointestinal ROS: no abdominal pain, change in bowel habits, or black or bloody stools Genito-Urinary ROS: no dysuria, trouble voiding, or hematuria Musculoskeletal ROS: negative for - joint pain or joint stiffness Neurological ROS: negative for - bowel and bladder control changes Dermatological ROS: negative for rash and skin lesion changes   Objective:   BP (!) 145/71   Pulse 98   Ht 5' 2.5 (1.588 m)   Wt 133 lb 8 oz (60.6 kg)   BMI 24.03 kg/m  CONSTITUTIONAL: Well-developed, well-nourished female in no acute distress.  PSYCHIATRIC: Normal mood and affect. Normal behavior. Normal judgment and thought content. NEUROLGIC: Alert and oriented to person, place, and time. Normal muscle tone coordination. No cranial nerve deficit noted. HENT:  Normocephalic, atraumatic, External right and left ear normal. Oropharynx is clear and moist EYES: Conjunctivae and EOM are normal. Pupils are equal, round, and reactive to light. No scleral icterus.  NECK: Normal range of motion, supple, no masses.  Normal thyroid.  SKIN: Skin is warm and dry. No rash noted. Not diaphoretic. No erythema. No pallor. CARDIOVASCULAR:  Normal heart rate noted, regular rhythm, no murmur. RESPIRATORY: Clear to auscultation bilaterally. Effort and breath sounds normal, no problems with respiration noted. BREASTS: Symmetric in size. No masses, skin changes, nipple drainage, or lymphadenopathy. ABDOMEN: Soft, normal bowel sounds, no distention noted.  No tenderness, rebound or guarding.  BLADDER: Normal PELVIC:  Bladder no bladder distension noted  Urethra: small caruncle present, no tenderness or lesions  Vulva: normal appearing vulva with no masses, tenderness or lesions  Vagina: atrophic, no lesions or discharge.   Cervix: normal appearing cervix without discharge or lesions  Uterus: uterus is normal size, shape, consistency and nontender  Adnexa: normal adnexa in size, nontender and no masses  RV: External Exam NormaI, No Rectal Masses, and Normal Sphincter tone  MUSCULOSKELETAL: Normal range of motion. No tenderness.  No cyanosis, clubbing, or edema.  2+ distal pulses. LYMPHATIC: No Axillary, Supraclavicular, or Inguinal Adenopathy.   Labs: Performed by her job.    Assessment:   1. Encounter for well woman exam with routine gynecological exam   2. Encounter for screening mammogram for malignant neoplasm of breast   3. White coat syndrome with high blood pressure but without hypertension   4. Osteopenia, unspecified location   5. Vaginal atrophy   6. Menopause   7. Urethral caruncle      Plan:  Pap: Not needed. Has aged out of screening.  Mammogram: Ordered Colon Screening:   UTD Labs:  Performed at her job yearly.  Routine preventative health maintenance measures emphasized:  Self Breast Exam and Exercise/Diet/Weight control Flu vaccine status: Up to date.  Menopausal with vaginal atrophy, asymptomatic.  Patient with history of whitecoat syndrome.  BP mildly elevated today however repeat was normal at the end of her visit. Return to Clinic - 1 Year   Archie Savers, MD [facility]

## 2023-04-18 ENCOUNTER — Other Ambulatory Visit: Payer: Self-pay | Admitting: Obstetrics and Gynecology

## 2023-04-18 ENCOUNTER — Ambulatory Visit (INDEPENDENT_AMBULATORY_CARE_PROVIDER_SITE_OTHER): Payer: BC Managed Care – PPO | Admitting: Obstetrics and Gynecology

## 2023-04-18 ENCOUNTER — Encounter: Payer: Self-pay | Admitting: Obstetrics and Gynecology

## 2023-04-18 VITALS — BP 145/71 | HR 98 | Ht 62.5 in | Wt 133.5 lb

## 2023-04-18 DIAGNOSIS — N362 Urethral caruncle: Secondary | ICD-10-CM

## 2023-04-18 DIAGNOSIS — Z1231 Encounter for screening mammogram for malignant neoplasm of breast: Secondary | ICD-10-CM

## 2023-04-18 DIAGNOSIS — N952 Postmenopausal atrophic vaginitis: Secondary | ICD-10-CM

## 2023-04-18 DIAGNOSIS — M8580 Other specified disorders of bone density and structure, unspecified site: Secondary | ICD-10-CM

## 2023-04-18 DIAGNOSIS — Z780 Asymptomatic menopausal state: Secondary | ICD-10-CM

## 2023-04-18 DIAGNOSIS — R030 Elevated blood-pressure reading, without diagnosis of hypertension: Secondary | ICD-10-CM

## 2023-04-18 DIAGNOSIS — Z01419 Encounter for gynecological examination (general) (routine) without abnormal findings: Secondary | ICD-10-CM

## 2023-05-09 ENCOUNTER — Ambulatory Visit
Admission: RE | Admit: 2023-05-09 | Discharge: 2023-05-09 | Disposition: A | Discharge status: Home / Self Care | Payer: BC Managed Care – PPO | Source: Ambulatory Visit | Attending: Obstetrics and Gynecology | Admitting: Obstetrics and Gynecology

## 2023-05-09 DIAGNOSIS — Z1231 Encounter for screening mammogram for malignant neoplasm of breast: Secondary | ICD-10-CM | POA: Diagnosis not present

## 2023-05-12 ENCOUNTER — Encounter: Payer: Self-pay | Admitting: Obstetrics and Gynecology

## 2023-05-31 DIAGNOSIS — H2513 Age-related nuclear cataract, bilateral: Secondary | ICD-10-CM | POA: Diagnosis not present

## 2023-05-31 DIAGNOSIS — H40003 Preglaucoma, unspecified, bilateral: Secondary | ICD-10-CM | POA: Diagnosis not present

## 2023-11-01 ENCOUNTER — Ambulatory Visit: Payer: BC Managed Care – PPO | Admitting: Dermatology

## 2023-11-05 ENCOUNTER — Ambulatory Visit: Admitting: Dermatology

## 2023-11-05 DIAGNOSIS — L570 Actinic keratosis: Secondary | ICD-10-CM | POA: Diagnosis not present

## 2023-11-05 DIAGNOSIS — W908XXA Exposure to other nonionizing radiation, initial encounter: Secondary | ICD-10-CM

## 2023-11-05 DIAGNOSIS — L814 Other melanin hyperpigmentation: Secondary | ICD-10-CM | POA: Diagnosis not present

## 2023-11-05 DIAGNOSIS — L578 Other skin changes due to chronic exposure to nonionizing radiation: Secondary | ICD-10-CM | POA: Diagnosis not present

## 2023-11-05 DIAGNOSIS — D1801 Hemangioma of skin and subcutaneous tissue: Secondary | ICD-10-CM

## 2023-11-05 DIAGNOSIS — L820 Inflamed seborrheic keratosis: Secondary | ICD-10-CM | POA: Diagnosis not present

## 2023-11-05 DIAGNOSIS — L738 Other specified follicular disorders: Secondary | ICD-10-CM

## 2023-11-05 DIAGNOSIS — L821 Other seborrheic keratosis: Secondary | ICD-10-CM

## 2023-11-05 DIAGNOSIS — Z7189 Other specified counseling: Secondary | ICD-10-CM

## 2023-11-05 DIAGNOSIS — R238 Other skin changes: Secondary | ICD-10-CM

## 2023-11-05 DIAGNOSIS — I781 Nevus, non-neoplastic: Secondary | ICD-10-CM

## 2023-11-05 DIAGNOSIS — Z8619 Personal history of other infectious and parasitic diseases: Secondary | ICD-10-CM

## 2023-11-05 DIAGNOSIS — Z79899 Other long term (current) drug therapy: Secondary | ICD-10-CM

## 2023-11-05 DIAGNOSIS — B009 Herpesviral infection, unspecified: Secondary | ICD-10-CM

## 2023-11-05 MED ORDER — ACYCLOVIR 5 % EX CREA
TOPICAL_CREAM | CUTANEOUS | 2 refills | Status: AC
Start: 2023-11-05 — End: ?

## 2023-11-05 NOTE — Patient Instructions (Addendum)
 For irritation at right corner of eye or eye area  Recommend OTC 1% hydrocortisone cream 1-2 times daily to affected area until itchy rash cleared.     Seborrheic Keratosis  What causes seborrheic keratoses? Seborrheic keratoses are harmless, common skin growths that first appear during adult life.  As time goes by, more growths appear.  Some people may develop a large number of them.  Seborrheic keratoses appear on both covered and uncovered body parts.  They are not caused by sunlight.  The tendency to develop seborrheic keratoses can be inherited.  They vary in color from skin-colored to gray, brown, or even black.  They can be either smooth or have a rough, warty surface.   Seborrheic keratoses are superficial and look as if they were stuck on the skin.  Under the microscope this type of keratosis looks like layers upon layers of skin.  That is why at times the top layer may seem to fall off, but the rest of the growth remains and re-grows.    Treatment Seborrheic keratoses do not need to be treated, but can easily be removed in the office.  Seborrheic keratoses often cause symptoms when they rub on clothing or jewelry.  Lesions can be in the way of shaving.  If they become inflamed, they can cause itching, soreness, or burning.  Removal of a seborrheic keratosis can be accomplished by freezing, burning, or surgery. If any spot bleeds, scabs, or grows rapidly, please return to have it checked, as these can be an indication of a skin cancer.   Cryotherapy Aftercare  Wash gently with soap and water everyday.   Apply Vaseline and Band-Aid daily until healed.     Due to recent changes in healthcare laws, you may see results of your pathology and/or laboratory studies on MyChart before the doctors have had a chance to review them. We understand that in some cases there may be results that are confusing or concerning to you. Please understand that not all results are received at the same time  and often the doctors may need to interpret multiple results in order to provide you with the best plan of care or course of treatment. Therefore, we ask that you please give us  2 business days to thoroughly review all your results before contacting the office for clarification. Should we see a critical lab result, you will be contacted sooner.   If You Need Anything After Your Visit  If you have any questions or concerns for your doctor, please call our main line at [phone] and press option 4 to reach your doctor's medical assistant. If no one answers, please leave a voicemail as directed and we will return your call as soon as possible. Messages left after 4 pm will be answered the following business day.   You may also send us  a message via MyChart. We typically respond to MyChart messages within 1-2 business days.  For prescription refills, please ask your pharmacy to contact our office. Our fax number is [phone].  If you have an urgent issue when the clinic is closed that cannot wait until the next business day, you can page your doctor at the number below.    Please note that while we do our best to be available for urgent issues outside of office hours, we are not available 24/7.   If you have an urgent issue and are unable to reach us , you may choose to seek medical care at your doctor's office, retail clinic,  urgent care center, or emergency room.  If you have a medical emergency, please immediately call 911 or go to the emergency department.  Pager Numbers  - Dr. Hester: [phone]  - Dr. Jackquline: [phone]  - Dr. Claudene: [phone]   - Dr. Raymund: [phone]  In the event of inclement weather, please call our main line at [phone] for an update on the status of any delays or closures.  Dermatology Medication Tips: Please keep the boxes that topical medications come in in order to help keep track of the instructions about where and how to use these.  Pharmacies typically print the medication instructions only on the boxes and not directly on the medication tubes.   If your medication is too expensive, please contact our office at [phone] option 4 or send us  a message through MyChart.   We are unable to tell what your co-pay for medications will be in advance as this is different depending on your insurance coverage. However, we may be able to find a substitute medication at lower cost or fill out paperwork to get insurance to cover a needed medication.   If a prior authorization is required to get your medication covered by your insurance company, please allow us  1-2 business days to complete this process.  Drug prices often vary depending on where the prescription is filled and some pharmacies may offer cheaper prices.  The website www.goodrx.com contains coupons for medications through different pharmacies. The prices here do not account for what the cost may be with help from insurance (it may be cheaper with your insurance), but the website can give you the price if you did not use any insurance.  - You can print the associated coupon and take it with your prescription to the pharmacy.  - You may also stop by our office during regular business hours and pick up a GoodRx coupon card.  - If you need your prescription sent electronically to a different pharmacy, notify our office through [facility] or by phone at [phone] option 4.     Si Usted Necesita Algo Despus de Su Visita  Tambin puede enviarnos un mensaje a travs de Clinical cytogeneticist. Por lo general respondemos a los mensajes de MyChart en el transcurso de 1 a 2 das hbiles.  Para renovar recetas, por favor pida a su farmacia que se ponga en contacto con nuestra oficina. Randi lakes de fax es [place] [phone].  Si tiene un asunto urgente cuando la clnica est cerrada y que no puede esperar hasta el siguiente da hbil, puede llamar/localizar a su doctor(a) al nmero  que aparece a continuacin.   Por favor, tenga en cuenta que aunque hacemos todo lo posible para estar disponibles para asuntos urgentes fuera del horario de [place], no estamos disponibles las 24 horas del da, los 7 [address]  [address] de la [place].   Si tiene un problema urgente y no puede comunicarse con nosotros, puede optar por buscar atencin mdica  en el consultorio de su doctor(a), en una clnica privada, en un centro de atencin urgente o en una sala de emergencias.  Si tiene Engineer, drilling, por favor llame inmediatamente al 911 o vaya a la sala de emergencias.  Nmeros de bper  - Dr. Hester: [phone]  - Dra. Jackquline: [phone]  - Dr. Claudene: [phone]  - Dra. Kitts: [phone]  En caso de inclemencias del [place], por favor llame a nuestra lnea principal al [phone] para una actualizacin sobre el estado de cualquier retraso o cierre.  Consejos para la medicacin en dermatologa: Por favor, guarde las cajas en las que vienen los medicamentos de uso tpico para ayudarle a seguir las instrucciones sobre dnde y cmo usarlos. Las farmacias generalmente imprimen las instrucciones del medicamento slo en las cajas y no directamente en los tubos del [place].   Si su medicamento es muy caro, por favor, pngase en contacto con landry rieger llamando al [phone] y presione la opcin 4 o envenos un mensaje a travs de Clinical cytogeneticist.   No podemos decirle cul ser su copago por los medicamentos por adelantado ya que esto es diferente dependiendo de la cobertura de su seguro. Sin embargo, es posible que podamos encontrar un medicamento sustituto a Audiological scientist un formulario para que el seguro cubra el medicamento que se considera necesario.   Si se requiere una autorizacin previa para que su compaa de seguros malta su medicamento, por favor permtanos de 1 a 2 das hbiles para completar este proceso.  Los precios de los medicamentos varan con frecuencia  dependiendo del Environmental consultant de dnde se surte la receta y alguna farmacias pueden ofrecer precios ms baratos.  El sitio web www.goodrx.com tiene cupones para medicamentos de Health and safety inspector. Los precios aqu no tienen en cuenta lo que podra costar con la ayuda del seguro (puede ser ms barato con su seguro), pero el sitio web puede darle el precio si no utiliz Tourist information centre manager.  - Puede imprimir el cupn correspondiente y llevarlo con su receta a la farmacia.  - Tambin puede pasar por nuestra oficina durante el horario de atencin regular y Education officer, museum una tarjeta de cupones de GoodRx.  - Si necesita que su receta se enve electrnicamente a una farmacia diferente, informe a nuestra oficina a travs de MyChart de [place] o por telfono llamando al [phone] y presione la opcin 4.

## 2023-11-05 NOTE — Progress Notes (Unsigned)
 Follow-Up Visit   Subjective  Jessica Frey is a 68 y.o. female who presents for the following: patient is here for yearly follow up on isks and history of hsv infection she used acyclovir  cream as needed.   Also reports a little redness after using cream/cleanser at face at right corner of eye and spot at right neck she would like checked   The following portions of the chart were reviewed this encounter and updated as appropriate: medications, allergies, medical history  Review of Systems:  No other skin or systemic complaints except as noted in HPI or Assessment and Plan.  Objective  Well appearing patient in no apparent distress; mood and affect are within normal limits.   A focused examination was performed of the following areas: Face, neck, arms, back,  Relevant exam findings are noted in the Assessment and Plan.  right neck x 3, right forehead x 1 (4) Erythematous stuck-on, waxy papule or plaque Head - Anterior (Face) Erythematous thin papules/macules with gritty scale.   Assessment & Plan   History of HSV (COLD SORES) Exam Clear today at left corner of lip Chronic and persistent condition with duration or expected duration over one year. Condition is bothersome/symptomatic for patient. Currently flared. Herpes Simplex Virus = Cold Sores = Fever Blisters is a chronic recurring blistering; scabbing sore-producing viral infection that is recurrent usually in the same area triggered by stress, sun/UV exposure and trauma.  It is infectious and can be spread from person to person by direct contact.  It is not curable, but is treatable with topical and oral medication. Treatment Plan Continue Acyclovir  cream.  Will consider Denavir cream if acyclovir  cream too expensive or not working well- apply to affected area 5 times daily at first onset of symptoms then continue 5 days Discussed oral antiviral treatment, but pt declines today. Recommend using a  moisturizer daily to dry  areas like  aquaphor / Vaseline   Sebaceous Hyperplasia - Small yellow papules with a central dell - Benign-appearing - Observe. Call for changes.  VEN[place] Exam: red or purple papule at Right superior helix  Treatment Plan:. Blanching at  Benign-appearing. Observe  Counseling for BBL / IPL / Laser and Coordination of Care Discussed the treatment option of Broad Band Light (BBL) /Intense Pulsed Light (IPL)/ Laser for skin discoloration, including brown spots and redness.  Typically we recommend at least 1-3 treatment sessions about 5-8 weeks apart for best results.  Cannot have tanned skin when BBL performed, and regular use of sunscreen/photoprotection is advised after the procedure to help maintain results. The patient's condition may also require maintenance treatments in the future.  The fee for BBL / laser treatments is $350 per treatment session for the whole face.  A fee can be quoted for other parts of the body.  Insurance typically does not pay for BBL/laser treatments and therefore the fee is an out-of-pocket cost. Recommend prophylactic valtrex treatment. Once scheduled for procedure, will send Rx in prior to patient's appointment.    SEBORRHEIC KERATOSIS - Stuck-on, waxy, tan-brown papules and/or plaques  - Benign-appearing - Discussed benign etiology and prognosis. - Observe - Call for any changes  HEMANGIOMA Exam: red papule(s) Discussed benign nature. Recommend observation. Call for changes.   LENTIGINES Exam: scattered tan macules Due to sun exposure Treatment Plan: Benign-appearing, observe. Recommend daily broad spectrum sunscreen SPF 30+ to sun-exposed areas, reapply every 2 hours as needed.  Call for any changes  ACTINIC DAMAGE - chronic, secondary to  cumulative UV radiation exposure/sun exposure over time - diffuse scaly erythematous macules with underlying dyspigmentation - Recommend daily broad spectrum sunscreen SPF 30+ to sun-exposed areas, reapply  every 2 hours as needed.  - Recommend staying in the shade or wearing long sleeves, sun glasses (UVA+UVB protection) and wide brim hats (4-inch brim around the entire circumference of the hat). - Call for new or changing lesions.  INFLAMED SEBORRHEIC KERATOSIS (4) right neck x 3, right forehead x 1 (4) Symptomatic, irritating, patient would like treated. Destruction of lesion - right neck x 3, right forehead x 1 (4) Complexity: simple   Destruction method: cryotherapy   Informed consent: discussed and consent obtained   Timeout:  patient name, date of birth, surgical site, and procedure verified Lesion destroyed using liquid nitrogen: Yes   Region frozen until ice ball extended beyond lesion: Yes   Outcome: patient tolerated procedure well with no complications   Post-procedure details: wound care instructions given    ACTINIC KERATOSIS Head - Anterior (Face) Actinic keratoses are precancerous spots that appear secondary to cumulative UV radiation exposure/sun exposure over time. They are chronic with expected duration over 1 year. A portion of actinic keratoses will progress to squamous cell carcinoma of the skin. It is not possible to reliably predict which spots will progress to skin cancer and so treatment is recommended to prevent development of skin cancer.  Recommend daily broad spectrum sunscreen SPF 30+ to sun-exposed areas, reapply every 2 hours as needed.  Recommend staying in the shade or wearing long sleeves, sun glasses (UVA+UVB protection) and wide brim hats (4-inch brim around the entire circumference of the hat). Call for new or changing lesions. Destruction of lesion - Head - Anterior (Face) Complexity: simple   Destruction method: cryotherapy   Informed consent: discussed and consent obtained   Timeout:  patient name, date of birth, surgical site, and procedure verified Lesion destroyed using liquid nitrogen: Yes   Region frozen until ice ball extended beyond lesion:  Yes   Outcome: patient tolerated procedure well with no complications   Post-procedure details: wound care instructions given    HSV INFECTION   Related Medications acyclovir  cream (ZOVIRAX ) 5 % Use topically to affected areas 5 x daily at first onset of symptoms and then for 5 days. HEMANGIOMA OF SKIN   ACTINIC SKIN DAMAGE   VEN[place]   COUNSELING AND COORDINATION OF CARE   SEBORRHEIC KERATOSIS   LENTIGO   SEBACEOUS HYPERPLASIA OF FACE    Return in about 1 year (around 11/04/2024) for hsv and isk follow up.  IEleanor Blush, CMA, am acting as scribe for Alm Rhyme, MD.   Documentation: I have reviewed the above documentation for accuracy and completeness, and I agree with the above.  Alm Rhyme, MD

## 2023-11-06 ENCOUNTER — Encounter: Payer: Self-pay | Admitting: Dermatology

## 2024-05-05 ENCOUNTER — Ambulatory Visit: Admitting: Obstetrics & Gynecology

## 2024-11-10 ENCOUNTER — Ambulatory Visit: Admitting: Dermatology
